# Patient Record
Sex: Female | Born: 1975 | Race: Black or African American | Hispanic: No | Marital: Single | Smoking: Never smoker
Health system: Southern US, Community
[De-identification: ages and names within clinical notes are randomized; demographics above are authoritative.]

## PROBLEM LIST (undated history)

## (undated) DIAGNOSIS — Z8619 Personal history of other infectious and parasitic diseases: Secondary | ICD-10-CM

## (undated) DIAGNOSIS — N390 Urinary tract infection, site not specified: Secondary | ICD-10-CM

## (undated) DIAGNOSIS — B379 Candidiasis, unspecified: Secondary | ICD-10-CM

## (undated) DIAGNOSIS — K219 Gastro-esophageal reflux disease without esophagitis: Secondary | ICD-10-CM

## (undated) DIAGNOSIS — M349 Systemic sclerosis, unspecified: Secondary | ICD-10-CM

## (undated) HISTORY — DX: Personal history of other infectious and parasitic diseases: Z86.19

## (undated) HISTORY — DX: Candidiasis, unspecified: B37.9

## (undated) HISTORY — DX: Urinary tract infection, site not specified: N39.0

## (undated) HISTORY — PX: NO PAST SURGERIES: SHX2092

---

## 2007-02-14 ENCOUNTER — Other Ambulatory Visit: Admission: RE | Admit: 2007-02-14 | Discharge: 2007-02-14 | Payer: Self-pay | Admitting: Obstetrics and Gynecology

## 2007-05-03 ENCOUNTER — Other Ambulatory Visit: Admission: RE | Admit: 2007-05-03 | Discharge: 2007-05-03 | Payer: Self-pay | Admitting: Obstetrics and Gynecology

## 2007-09-16 ENCOUNTER — Other Ambulatory Visit: Admission: RE | Admit: 2007-09-16 | Discharge: 2007-09-16 | Payer: Self-pay | Admitting: Obstetrics and Gynecology

## 2010-12-22 DIAGNOSIS — M349 Systemic sclerosis, unspecified: Secondary | ICD-10-CM

## 2010-12-22 HISTORY — DX: Systemic sclerosis, unspecified: M34.9

## 2011-01-22 DIAGNOSIS — N390 Urinary tract infection, site not specified: Secondary | ICD-10-CM

## 2011-01-22 HISTORY — DX: Urinary tract infection, site not specified: N39.0

## 2011-02-17 LAB — OB RESULTS CONSOLE ANTIBODY SCREEN: Antibody Screen: NEGATIVE

## 2011-02-17 LAB — OB RESULTS CONSOLE RUBELLA ANTIBODY, IGM: Rubella: IMMUNE

## 2011-02-17 LAB — OB RESULTS CONSOLE HEPATITIS B SURFACE ANTIGEN: Hepatitis B Surface Ag: NEGATIVE

## 2011-03-09 ENCOUNTER — Other Ambulatory Visit: Payer: Self-pay

## 2011-03-09 ENCOUNTER — Inpatient Hospital Stay (HOSPITAL_COMMUNITY)
Admission: AD | Admit: 2011-03-09 | Discharge: 2011-03-09 | Disposition: A | Discharge status: Home / Self Care | Payer: Medicaid Other | Source: Ambulatory Visit | Attending: Obstetrics and Gynecology | Admitting: Obstetrics and Gynecology

## 2011-03-09 ENCOUNTER — Encounter (HOSPITAL_COMMUNITY): Payer: Self-pay

## 2011-03-09 DIAGNOSIS — O99891 Other specified diseases and conditions complicating pregnancy: Secondary | ICD-10-CM | POA: Insufficient documentation

## 2011-03-09 DIAGNOSIS — M349 Systemic sclerosis, unspecified: Secondary | ICD-10-CM | POA: Insufficient documentation

## 2011-03-09 DIAGNOSIS — R0602 Shortness of breath: Secondary | ICD-10-CM | POA: Insufficient documentation

## 2011-03-09 HISTORY — DX: Systemic sclerosis, unspecified: M34.9

## 2011-03-09 LAB — URINALYSIS, ROUTINE W REFLEX MICROSCOPIC
Glucose, UA: NEGATIVE mg/dL
Hgb urine dipstick: NEGATIVE
Specific Gravity, Urine: 1.01 (ref 1.005–1.030)

## 2011-03-09 LAB — CBC
HCT: 38.5 % (ref 36.0–46.0)
Hemoglobin: 12.6 g/dL (ref 12.0–15.0)
MCV: 93.4 fL (ref 78.0–100.0)
RBC: 4.12 MIL/uL (ref 3.87–5.11)
WBC: 6.4 10*3/uL (ref 4.0–10.5)

## 2011-03-09 LAB — DIFFERENTIAL
Eosinophils Relative: 1 % (ref 0–5)
Lymphocytes Relative: 28 % (ref 12–46)
Lymphs Abs: 1.8 10*3/uL (ref 0.7–4.0)
Monocytes Absolute: 0.5 10*3/uL (ref 0.1–1.0)

## 2011-03-09 MED ORDER — OXYCODONE-ACETAMINOPHEN 10-325 MG PO TABS: 1.0000 | ORAL_TABLET | ORAL | Status: DC | PRN

## 2011-03-09 MED ORDER — LIDOCAINE HCL 2 % EX GEL: CUTANEOUS | Status: DC | PRN

## 2011-03-09 MED ORDER — IBUPROFEN 800 MG PO TABS: 800.0000 mg | ORAL_TABLET | Freq: Three times a day (TID) | ORAL | Status: DC | PRN

## 2011-03-09 NOTE — ED Provider Notes (Signed)
History   35 yo G2P0010 at 12 weeks presented c/op episode of shortness of breath upon awakening this am.  Denied cough, chest or nasal congestion, fever, viral exposure, asthma, chest pain, or extremity pain.  Diagnosed with scleroderma in Sept or October of this year, after 2 previous years of symptoms of thickened skin, blue hands.  Followed at [facility], no current medications--no pending appointment, and rheumatologist does not know patient is pregnant.   Patient had [place] at [facility] 03/03/11, no labs yet--office has requested records from [facility] regarding labs already done.  Has next visit at [facility] 03/30/10.  Reports shortness of breath woke her up, persisted when getting dressed for work, and continued at work.  Presented unannounced to [facility]U.  Reports symptoms have resolved at present.  In NAD.  History remarkable for: Scleroderma 1 previous SAB  Chief Complaint  Patient presents with  . Shortness of Breath     OB History    Grav Para Term Preterm Abortions TAB SAB Ect Mult Living   2 0 0 0 1 0 1 0 0 0     Previous SAB  Past Medical History  Diagnosis Date  . Scleroderma     dx October 2012    History reviewed. No pertinent past surgical history.  History reviewed. No pertinent family history.  History  Substance Use Topics  . Smoking status: Not on file  . Smokeless tobacco: Not on file  . Alcohol Use:     Allergies:  Allergies  Allergen Reactions  . Sulfa Antibiotics     Throat burning    Prescriptions prior to admission  Medication Sig Dispense Refill  . prenatal vitamin w/FE, FA (PRENATAL 1 + 1) 27-1 MG TABS Take 1 tablet by mouth daily.           Physical Exam   Blood pressure 91/61, pulse 75, temperature 97.5 F (36.4 C), temperature source Oral, resp. rate 18, height 5\' 8"  (1.727 m), weight 69.57 kg (153 lb 6 oz), SpO2 99.00%.  In NAD--able to sit, stand, walk, and lie flat without SOB. Chest clear Heart RRR without mumur Abd gravid, NT, FH approx  12 week size Pelvic--deferred Extremities--No edema, negative Homan's, full ROM, DTR 1+ without clonus.   ED Course  IUP at 12 weeks Episode SOB--now resolved Scleroderma  Plan: Consulted with Dr. Estanislado Pandy. Check UA, CBC w/diff, EKG. If WNL, will d/c home. Will check with office regarding status of previous records. Anticipate referral to MFM as pregnancy progresses.  Nigel Bridgeman, CNM, [place] 03/09/11  9:10a   Addendum: Still feeling OK--no recurrence of SOB.  Filed Vitals:   03/09/11 0825 03/09/11 0827 03/09/11 0832 03/09/11 0938  BP:    94/62  Pulse:  75 75 66  Temp:      TempSrc:      Resp:  18 18 16   Height:      Weight:      SpO2: 99%  99% 99%   Results for orders placed during the hospital encounter of 03/09/11 (from the past 24 hour(s))  URINALYSIS, ROUTINE W REFLEX MICROSCOPIC     Status: Normal   Collection Time   03/09/11  8:08 AM      Component Value Range   Color, Urine YELLOW  YELLOW    APPearance CLEAR  CLEAR    Specific Gravity, Urine 1.010  1.005 - 1.030    pH 6.5  5.0 - 8.0    Glucose, UA NEGATIVE  NEGATIVE (mg/dL)   Hgb  urine dipstick NEGATIVE  NEGATIVE    Bilirubin Urine NEGATIVE  NEGATIVE    Ketones, ur NEGATIVE  NEGATIVE (mg/dL)   Protein, ur NEGATIVE  NEGATIVE (mg/dL)   Urobilinogen, UA 1.0  0.0 - 1.0 (mg/dL)   Nitrite NEGATIVE  NEGATIVE    Leukocytes, UA NEGATIVE  NEGATIVE   CBC     Status: Normal   Collection Time   03/09/11  9:33 AM      Component Value Range   WBC 6.4  4.0 - 10.5 (K/uL)   RBC 4.12  3.87 - 5.11 (MIL/uL)   Hemoglobin 12.6  12.0 - 15.0 (g/dL)   HCT 16.1  09.6 - 04.5 (%)   MCV 93.4  78.0 - 100.0 (fL)   MCH 30.6  26.0 - 34.0 (pg)   MCHC 32.7  30.0 - 36.0 (g/dL)   RDW 40.9  81.1 - 91.4 (%)   Platelets 263  150 - 400 (K/uL)  DIFFERENTIAL     Status: Normal   Collection Time   03/09/11  9:33 AM      Component Value Range   Neutrophils Relative 63  43 - 77 (%)   Neutro Abs 4.0  1.7 - 7.7 (K/uL)   Lymphocytes Relative  28  12 - 46 (%)   Lymphs Abs 1.8  0.7 - 4.0 (K/uL)   Monocytes Relative 8  3 - 12 (%)   Monocytes Absolute 0.5  0.1 - 1.0 (K/uL)   Eosinophils Relative 1  0 - 5 (%)   Eosinophils Absolute 0.1  0.0 - 0.7 (K/uL)   Basophils Relative 0  0 - 1 (%)   Basophils Absolute 0.0  0.0 - 0.1 (K/uL)   D/C'd home with precautions reviewed.  To return with any issues. Recommended patient call rheumatologist to inform of pregnancy. I will have office staff follow-up on obtaining records from [facility].   Nigel Bridgeman, CNM, [place] 03/09/11 9:55am

## 2011-03-09 NOTE — Progress Notes (Signed)
Pt states has sclederma, shortness of breath since 0400 this am. Hx of SOB with disease process, however pt states she was concerned since she is pregnant. Denies bleeding or vag d/c changes. 02 sat in triage 98%ra.

## 2011-03-24 NOTE — L&D Delivery Note (Addendum)
Delivery Note At 2:51 AM a viable female, "Veronica Houston", was delivered via Vaginal, Spontaneous Delivery (Presentation: Left Occiput Anterior).  APGAR: 8, 9; weight .   Placenta status: Intact, Spontaneous.  Cord: 3 vessels with the following complications: None.  Cord pH: NA  Anesthesia: Epidural  Episiotomy: None Lacerations: Bilateral periurethral lacerations--did not require repair. Suture Repair: None Est. Blood Loss (mL): 300 cc.  Mom to postpartum.  Baby to skin to skin. Circ planned as outpatient. Patient desires BTL.  Consent form in prenata. Will make NPO x ice chips, and maintain epidural catheter. Dr. Pennie Rushing notified of patient's delivery and desire for BTL:.  Nigel Bridgeman 09/11/2011, 3:47 AM

## 2011-04-30 ENCOUNTER — Other Ambulatory Visit: Payer: Self-pay

## 2011-05-11 ENCOUNTER — Encounter (HOSPITAL_COMMUNITY): Payer: Medicaid Other

## 2011-05-13 ENCOUNTER — Ambulatory Visit (HOSPITAL_COMMUNITY): Payer: Medicaid Other

## 2011-05-13 ENCOUNTER — Ambulatory Visit (HOSPITAL_COMMUNITY)
Admission: RE | Admit: 2011-05-13 | Discharge: 2011-05-13 | Disposition: A | Discharge status: Home / Self Care | Payer: Medicaid Other | Source: Ambulatory Visit | Attending: Obstetrics and Gynecology | Admitting: Obstetrics and Gynecology

## 2011-05-13 ENCOUNTER — Encounter (HOSPITAL_COMMUNITY): Payer: Self-pay

## 2011-05-13 DIAGNOSIS — Z1231 Encounter for screening mammogram for malignant neoplasm of breast: Secondary | ICD-10-CM | POA: Insufficient documentation

## 2011-05-14 LAB — LUPUS ANTICOAGULANT PANEL: PTT Lupus Anticoagulant: 35.6 secs (ref 28.0–43.0)

## 2011-05-14 LAB — CARDIOLIPIN ANTIBODIES, IGG, IGM, IGA
Anticardiolipin IgG: 12 GPL U/mL (ref <23)
Anticardiolipin IgM: 4 MPL U/mL — ABNORMAL LOW (ref <11)

## 2011-05-14 LAB — ANTI-NUCLEAR AB-TITER (ANA TITER)

## 2011-05-14 LAB — ANA: Anti Nuclear Antibody(ANA): POSITIVE — AB

## 2011-05-27 ENCOUNTER — Encounter (INDEPENDENT_AMBULATORY_CARE_PROVIDER_SITE_OTHER): Payer: Medicaid Other | Admitting: Obstetrics and Gynecology

## 2011-05-27 DIAGNOSIS — Z331 Pregnant state, incidental: Secondary | ICD-10-CM

## 2011-06-05 ENCOUNTER — Encounter (INDEPENDENT_AMBULATORY_CARE_PROVIDER_SITE_OTHER): Payer: Medicaid Other | Admitting: Obstetrics and Gynecology

## 2011-06-05 DIAGNOSIS — Z331 Pregnant state, incidental: Secondary | ICD-10-CM

## 2011-06-07 NOTE — Progress Notes (Signed)
MFM Note  Veronica Houston is a 36 year old G32P0A1 AA female at 21+ weeks who presented for consultation due to a recent diagnosis of scleroderma. For the past 2 years, Veronica Houston has been presenting to her primary care doctors complaining of blue fingertips, swollen hands, darker areas of skin, shortness of breath and fatigue. During their workup, her ANA returned 1: 640. She was referred to a rheumatologist and was seen in October of 2012 by Dr. Virgel Manifold. 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 He did not prescribe any treatment or medication at that time.   Her symptoms have continued and fatigue and shortness of breath are the most troublesome. She also noticed, just recently, pain in her upper legs.  She denies having hypertension, diabetes, renal disease or any other possible symptoms related to scleroderma including GI, cardiac, neuromuscular, and GU involvement. No history of surgeries.  Her past history is significant for migraines, anxiety and MRSA leg abscess. Allergies: sulfa drugs.  Her only other pregnancy ended at ~6 weeks with a SAB in 2007  She works full time as a Research scientist (physical sciences).  We discussed scleroderma and pregnancy. There are very few publications in this area; however, there appeared to be a trend towards more preterm deliveries and small-for-gestational age fetuses as compared to RA and control group patients. If she should develop renal disease during pregnancy, it can very difficult to distinguish between the renal disease of preeclampsia. Her Raynaud symptoms may improve during pregnancy but other symptoms may worsen such as shortness of breath, leg pain and GERD.  Assessment: 1) IUP at 21+ weeks 2) Suspect some type of early scleroderma  - still symptomatic with fatigue and shortness of breath; I feel that she may benefit from some type of treatment,  but will need rheumatology input 3) H/O one SAB 4) H/O migraine headaches 5) H/O anxiety 6) H/O MRSA leg abscess  Recommendations 1) Baseline labs; 24 hour urine collection for protein 2) Serial USs for growth 3) If growth concerning, then antenatal testing in the third trimester 4) Delivery by 39 weeks 5) Ms. Shampine to contact Dr. Virgel Manifold to see if he will see her again while pregnant and with new insurance 6) I will check with [facility] to see if she can be seen in their rheumatology clinic  SSA, SSB, aCL, LAC and ANA were drawn today.  (Face-to-face consultation with patient: 30 min)

## 2011-06-09 ENCOUNTER — Encounter: Payer: Medicaid Other | Admitting: Obstetrics and Gynecology

## 2011-06-10 ENCOUNTER — Encounter (INDEPENDENT_AMBULATORY_CARE_PROVIDER_SITE_OTHER): Payer: Medicaid Other | Admitting: Obstetrics and Gynecology

## 2011-06-10 DIAGNOSIS — Z3480 Encounter for supervision of other normal pregnancy, unspecified trimester: Secondary | ICD-10-CM

## 2011-06-22 ENCOUNTER — Other Ambulatory Visit: Payer: Self-pay | Admitting: Obstetrics and Gynecology

## 2011-06-22 DIAGNOSIS — M349 Systemic sclerosis, unspecified: Secondary | ICD-10-CM

## 2011-06-22 DIAGNOSIS — Z331 Pregnant state, incidental: Secondary | ICD-10-CM

## 2011-06-30 ENCOUNTER — Other Ambulatory Visit: Payer: Medicaid Other

## 2011-06-30 ENCOUNTER — Encounter: Payer: Self-pay | Admitting: Obstetrics and Gynecology

## 2011-06-30 ENCOUNTER — Telehealth: Payer: Self-pay | Admitting: Obstetrics and Gynecology

## 2011-06-30 ENCOUNTER — Ambulatory Visit (INDEPENDENT_AMBULATORY_CARE_PROVIDER_SITE_OTHER): Payer: Medicaid Other

## 2011-06-30 ENCOUNTER — Ambulatory Visit (INDEPENDENT_AMBULATORY_CARE_PROVIDER_SITE_OTHER): Payer: Medicaid Other | Admitting: Obstetrics and Gynecology

## 2011-06-30 ENCOUNTER — Other Ambulatory Visit: Payer: Self-pay | Admitting: Obstetrics and Gynecology

## 2011-06-30 VITALS — BP 104/60 | Wt 173.5 lb

## 2011-06-30 DIAGNOSIS — Z331 Pregnant state, incidental: Secondary | ICD-10-CM

## 2011-06-30 DIAGNOSIS — M349 Systemic sclerosis, unspecified: Secondary | ICD-10-CM

## 2011-06-30 DIAGNOSIS — O0990 Supervision of high risk pregnancy, unspecified, unspecified trimester: Secondary | ICD-10-CM | POA: Insufficient documentation

## 2011-06-30 DIAGNOSIS — O26879 Cervical shortening, unspecified trimester: Secondary | ICD-10-CM | POA: Insufficient documentation

## 2011-06-30 LAB — US OB FOLLOW UP

## 2011-06-30 LAB — FETAL FIBRONECTIN: Fetal Fibronectin: NEGATIVE

## 2011-06-30 NOTE — Progress Notes (Signed)
Subjective:    Veronica Houston is a 36 y.o. female being seen today for her obstetrical visit. She is at [redacted]w[redacted]d gestation. Patient reports no bleeding, no contractions, no cramping, no leaking and left ankle pain especially when not active. Fetal movement: normal.  Review of Systems:   Review of Systems  Constitutional: Negative for fever and chills.  Respiratory: Positive for cough. Negative for apnea, choking, chest tightness, shortness of breath, wheezing and stridor.        Nonproductive cough for 2 wks  Musculoskeletal: Positive for arthralgias.       Left ankle pain     Objective:    BP 104/60  Wt 173 lb 8 oz (78.699 kg)  LMP 12/19/2010  Physical Exam  Maternal Exam:  Abdomen: Patient reports no abdominal tenderness. Introitus: Normal vulva.  Left ankle without edema or tenderness to palpation  FHT:  146 BPM  Uterine Size: size equals dates  Presentation: cephalic   Cervix: long/closed with presenting part ballotable  Assessment:    Pregnancy:  G2P0010 Cervical shortening on [place] without symptoms Scleroderma with possible L ankle arthralgia Persistent nonproductive cough    Plan:    Patient Active Problem List  Diagnoses  . Scleroderma  . Pregnancy complicated by cervical shortening  . Pregnancy, supervision, high-risk    FFN.  If positive, needs steroids.  If negative, repeat US in one wk and FFN in 2 wks Follow up in 1 Week with [place].  If stable, will allow pt to continue work. Note for work to limit lifting, pushing, pulling, tossing of packages to less than 10 lbs. 24 hour urine for protein per MFM consult.   OTC Claritin for cough.  If it persists, recommend CXR to rule out pulmonary involvement with autoimmune disease.

## 2011-06-30 NOTE — Telephone Encounter (Signed)
TC from patient for FFN results = Negative Results reviewed. Patient requested note for work yesterday (felt bad) Also needed note for work at the [employer] indicating limitations In pulling/pushing/lifting over 10 lbs and no bending. Notes written, available in "notes" section of chart. Patient will pick up letters for work tomorrow. Message left on Triage voice mail indicating patient will pick up tomorrow.  Nigel Bridgeman, CNM, [place] 06/30/11 6:40pm

## 2011-06-30 NOTE — Progress Notes (Signed)
PT C/O LEFT ANKLE  PAIN X 2-3 MONTHS

## 2011-07-01 LAB — RPR

## 2011-07-01 LAB — PROTEIN, URINE, 24 HOUR
Protein, 24H Urine: 140 mg/d — ABNORMAL HIGH (ref 50–100)
Protein, Urine: 5 mg/dL

## 2011-07-01 LAB — GLUCOSE TOLERANCE, 1 HOUR: Glucose, 1 Hour GTT: 71 mg/dL (ref 70–140)

## 2011-07-07 ENCOUNTER — Ambulatory Visit (HOSPITAL_COMMUNITY)
Admission: RE | Admit: 2011-07-07 | Discharge: 2011-07-07 | Disposition: A | Discharge status: Home / Self Care | Payer: Medicaid Other | Source: Ambulatory Visit | Attending: Obstetrics and Gynecology | Admitting: Obstetrics and Gynecology

## 2011-07-07 ENCOUNTER — Ambulatory Visit (INDEPENDENT_AMBULATORY_CARE_PROVIDER_SITE_OTHER): Payer: Medicaid Other | Admitting: Obstetrics and Gynecology

## 2011-07-07 ENCOUNTER — Other Ambulatory Visit: Payer: Medicaid Other

## 2011-07-07 ENCOUNTER — Ambulatory Visit (INDEPENDENT_AMBULATORY_CARE_PROVIDER_SITE_OTHER): Payer: Medicaid Other

## 2011-07-07 ENCOUNTER — Other Ambulatory Visit: Payer: Self-pay | Admitting: Obstetrics and Gynecology

## 2011-07-07 VITALS — BP 98/58 | Wt 176.0 lb

## 2011-07-07 DIAGNOSIS — R053 Chronic cough: Secondary | ICD-10-CM

## 2011-07-07 DIAGNOSIS — Z331 Pregnant state, incidental: Secondary | ICD-10-CM

## 2011-07-07 DIAGNOSIS — O99891 Other specified diseases and conditions complicating pregnancy: Secondary | ICD-10-CM | POA: Insufficient documentation

## 2011-07-07 DIAGNOSIS — R05 Cough: Secondary | ICD-10-CM | POA: Insufficient documentation

## 2011-07-07 DIAGNOSIS — M349 Systemic sclerosis, unspecified: Secondary | ICD-10-CM

## 2011-07-07 DIAGNOSIS — R059 Cough, unspecified: Secondary | ICD-10-CM

## 2011-07-07 LAB — US OB TRANSVAGINAL

## 2011-07-07 MED ORDER — AZITHROMYCIN 250 MG PO TABS: ORAL_TABLET | ORAL | Status: AC

## 2011-07-07 MED ORDER — HYDROXYZINE PAMOATE 25 MG PO CAPS: 25.0000 mg | ORAL_CAPSULE | ORAL | Status: AC | PRN

## 2011-07-07 NOTE — Progress Notes (Addendum)
Subjective:    Veronica Houston is a 36 y.o. female being seen today for her obstetrical visit. She is at [redacted]w[redacted]d gestation. Patient reports denies VB, cramping, LOF, or ctx, c/o peristent cough. Fetal movement: normal.  Review of Systems:   Review of Systems  HENT: Positive for congestion and rhinorrhea.   Respiratory: Positive for cough. Negative for apnea, chest tightness, shortness of breath and wheezing.        Cough is persistent x1 month  All other systems reviewed and are negative.     Objective:    BP 98/58  Wt 176 lb (79.833 kg)  LMP 12/19/2010  Physical Exam  Nursing note and vitals reviewed. Constitutional: She appears well-developed and well-nourished.  Eyes: Pupils are equal, round, and reactive to light.  Neck: Normal range of motion.  Cardiovascular: Normal rate, regular rhythm and normal heart sounds.   Respiratory: Effort normal and breath sounds normal. No respiratory distress. She has no wheezes.  GI: Soft.       s=d  Lymphadenopathy:       Head (right side): Tonsillar adenopathy present.    She has cervical adenopathy.       Right: Supraclavicular adenopathy present.    Exam Korea today for cervical length = 2.52cm, no funneling noted (previously 2.43cm on 4-9)  FHT:  143 BPM  Uterine Size: size equals dates  Presentation: cephalic     Baseline 24hour urine protein = 140mg   Assessment:    Pregnancy:  G2P0010 Suspect URI - will obtain chest xray  Cervical length stable - pt may return to work     Plan:    Patient Active Problem List  Diagnoses  . Scleroderma  . Pregnancy complicated by cervical shortening  . Pregnancy, supervision, high-risk    1wk f/u for repeat FFN 4wks growth [place] To [facility] for chest xray zpack RX Recommend mucinex, Pt request med for sleep states benedryl causes severe fatigue will RX vistaril Follow up in 1 Week.

## 2011-07-08 ENCOUNTER — Telehealth: Payer: Self-pay

## 2011-07-08 NOTE — Telephone Encounter (Signed)
Spoke with pt informing her she was exposed to shingles yesterday afternoon during her chest xray. Sherry from the radiology dept at [facility] called to inform us per hospital protocol, the xray technician was diagnosed yesterday evening. Informed pt since she has had chicken pox as a child and the vaccination, no further testing is needed. Pt voices understanding.

## 2011-07-14 ENCOUNTER — Ambulatory Visit (INDEPENDENT_AMBULATORY_CARE_PROVIDER_SITE_OTHER): Payer: Medicaid Other | Admitting: Obstetrics and Gynecology

## 2011-07-14 ENCOUNTER — Encounter: Payer: Self-pay | Admitting: Obstetrics and Gynecology

## 2011-07-14 VITALS — BP 100/56 | Wt 179.0 lb

## 2011-07-14 DIAGNOSIS — O26879 Cervical shortening, unspecified trimester: Secondary | ICD-10-CM

## 2011-07-14 DIAGNOSIS — G4700 Insomnia, unspecified: Secondary | ICD-10-CM

## 2011-07-14 NOTE — Progress Notes (Signed)
No c/o.  Cx stable with current work and activity schedule [place] at [facility] for growth and cx length

## 2011-07-21 ENCOUNTER — Encounter: Payer: Self-pay | Admitting: Obstetrics and Gynecology

## 2011-07-21 ENCOUNTER — Ambulatory Visit (INDEPENDENT_AMBULATORY_CARE_PROVIDER_SITE_OTHER): Payer: Medicaid Other | Admitting: Obstetrics and Gynecology

## 2011-07-21 VITALS — BP 100/60 | Temp 97.2°F | Wt 177.0 lb

## 2011-07-21 DIAGNOSIS — O0990 Supervision of high risk pregnancy, unspecified, unspecified trimester: Secondary | ICD-10-CM

## 2011-07-21 DIAGNOSIS — M349 Systemic sclerosis, unspecified: Secondary | ICD-10-CM

## 2011-07-21 NOTE — Progress Notes (Addendum)
No complaints FKCs and PTL precautions Cervix unchanged U/S at [facility] for EFW and check cervix secondary to scleroderma Wants sterilization - sign tubal papers today

## 2011-07-21 NOTE — Patient Instructions (Signed)
Needs to sign tubal papers

## 2011-07-23 ENCOUNTER — Telehealth: Payer: Self-pay | Admitting: Obstetrics and Gynecology

## 2011-07-23 NOTE — Telephone Encounter (Signed)
Lm on vm tcb rgd msg 

## 2011-07-23 NOTE — Telephone Encounter (Signed)
Routed to triage 

## 2011-07-27 NOTE — Telephone Encounter (Signed)
Spoke with pt rgd msg pt want rx for pain has appt 07/31/11 advised pt can discuss at [facility]. Pt voice understanding

## 2011-07-31 ENCOUNTER — Ambulatory Visit (INDEPENDENT_AMBULATORY_CARE_PROVIDER_SITE_OTHER): Payer: Medicaid Other

## 2011-07-31 ENCOUNTER — Ambulatory Visit (INDEPENDENT_AMBULATORY_CARE_PROVIDER_SITE_OTHER): Payer: Medicaid Other | Admitting: Obstetrics and Gynecology

## 2011-07-31 VITALS — BP 104/62 | Wt 176.0 lb

## 2011-07-31 DIAGNOSIS — O0990 Supervision of high risk pregnancy, unspecified, unspecified trimester: Secondary | ICD-10-CM

## 2011-07-31 DIAGNOSIS — Z3400 Encounter for supervision of normal first pregnancy, unspecified trimester: Secondary | ICD-10-CM

## 2011-07-31 DIAGNOSIS — O26879 Cervical shortening, unspecified trimester: Secondary | ICD-10-CM

## 2011-07-31 DIAGNOSIS — M349 Systemic sclerosis, unspecified: Secondary | ICD-10-CM

## 2011-07-31 LAB — US OB FOLLOW UP

## 2011-07-31 NOTE — Progress Notes (Signed)
No complaints except recently placed back on plaquenil by her MD secondary to scleroderma bothering her. Reviewed plaquenil is cat C U/S reviewed - 4lbs 10oz, 55%, AFI 14, vtx, ant placenta, cvx with no funneling meas 2.5cm (last u/s 4/9 meas 2.4cm. Stable cervix Cont limited activity Pt says she comes weekly FKCs RTO 1wk

## 2011-08-07 ENCOUNTER — Ambulatory Visit (INDEPENDENT_AMBULATORY_CARE_PROVIDER_SITE_OTHER): Payer: Medicaid Other | Admitting: Obstetrics and Gynecology

## 2011-08-07 ENCOUNTER — Encounter: Payer: Self-pay | Admitting: Obstetrics and Gynecology

## 2011-08-07 VITALS — BP 102/58 | Wt 179.0 lb

## 2011-08-07 DIAGNOSIS — Z331 Pregnant state, incidental: Secondary | ICD-10-CM

## 2011-08-07 NOTE — Progress Notes (Signed)
Pt without complaint [facility] reviewed Korea @ 35-36 weeks No cervical change Continue plaquenil for scleroderma

## 2011-08-07 NOTE — Progress Notes (Signed)
Pt is concerned about swelling in her feet. Pt did not leave a urine sample but will leave one before she leaves the office today.

## 2011-08-12 ENCOUNTER — Encounter: Payer: Self-pay | Admitting: Obstetrics and Gynecology

## 2011-08-12 ENCOUNTER — Ambulatory Visit (INDEPENDENT_AMBULATORY_CARE_PROVIDER_SITE_OTHER): Payer: Medicaid Other | Admitting: Obstetrics and Gynecology

## 2011-08-12 VITALS — BP 90/58 | Wt 177.0 lb

## 2011-08-12 DIAGNOSIS — Z331 Pregnant state, incidental: Secondary | ICD-10-CM

## 2011-08-12 NOTE — Progress Notes (Signed)
Patient ID: Veronica Houston, female   DOB: [birth year], 36 y.o.   MRN: [id number] Reviewed s/s preterm labor, srom, vag bleeding, kick counts to report, enc 8 water daily and frequent voids Lavera Guise, CNM

## 2011-08-12 NOTE — Progress Notes (Signed)
Wants cervix checked

## 2011-08-13 ENCOUNTER — Telehealth: Payer: Self-pay | Admitting: Obstetrics and Gynecology

## 2011-08-13 NOTE — Telephone Encounter (Signed)
Spoke with pt rgd concerns pt 34 weeks preg c/o abdominal pain radiating to back ? Contraction positive fetal movement no spotting no bleeding no leaking fluid did no not take anything for discomfort only drinking two bottles of water a day. Advised pt to take tylenol every 6hrs increase water intake if not better call office for eval pt voice understanding

## 2011-08-18 ENCOUNTER — Telehealth: Payer: Self-pay | Admitting: Obstetrics and Gynecology

## 2011-08-18 NOTE — Telephone Encounter (Signed)
TC from pt. States had cont q 1-2 hr last PM.  Today having 2-3/hr. +FM  NO vag leakage.  Drank 4 glasses water today.   Is expected to stand at work.  Will consult with provider.

## 2011-08-18 NOTE — Telephone Encounter (Signed)
TC to pt. Per SL advised to continue to increase water.  Rest whenerver possible. To call if having 5 or more cont /hr  or sypmtoms increase,  Pt states is very tired and does not feel not working the next  3 hours..  Explained can only give work note for medical reason.  Pt understanding. R/s appt to earlier in day 08/19/11.

## 2011-08-18 NOTE — Telephone Encounter (Signed)
TC to pt.  Per SL informed if concerned and feels needs eval can be seen at [facility].  If chooses, may leave work to rest.   Pt verbalizes comprehension and will call if desires eval or sx increase.

## 2011-08-19 ENCOUNTER — Encounter: Payer: Self-pay | Admitting: Obstetrics and Gynecology

## 2011-08-19 ENCOUNTER — Encounter: Payer: Medicaid Other | Admitting: Obstetrics and Gynecology

## 2011-08-19 ENCOUNTER — Ambulatory Visit (INDEPENDENT_AMBULATORY_CARE_PROVIDER_SITE_OTHER): Payer: Medicaid Other | Admitting: Obstetrics and Gynecology

## 2011-08-19 VITALS — BP 108/56 | Wt 177.0 lb

## 2011-08-19 DIAGNOSIS — Z3480 Encounter for supervision of other normal pregnancy, unspecified trimester: Secondary | ICD-10-CM

## 2011-08-19 DIAGNOSIS — N899 Noninflammatory disorder of vagina, unspecified: Secondary | ICD-10-CM

## 2011-08-19 DIAGNOSIS — M349 Systemic sclerosis, unspecified: Secondary | ICD-10-CM

## 2011-08-19 DIAGNOSIS — N898 Other specified noninflammatory disorders of vagina: Secondary | ICD-10-CM

## 2011-08-19 LAB — POCT URINALYSIS DIPSTICK
Blood, UA: NEGATIVE
Glucose, UA: NEGATIVE
Ketones, UA: NEGATIVE
Protein, UA: NEGATIVE
Spec Grav, UA: 1.015

## 2011-08-19 NOTE — Progress Notes (Signed)
C/o of vaginal irritation (Thinks it may be laundry detergent)

## 2011-08-19 NOTE — Progress Notes (Signed)
Doing well. Some contractions this week. GBS today. Cervix FT, 50%,vtx -1 Per Dr. Sherrie George original plan, plan induction at 39 weeks, with Korea for growth at 36 weeks.  Will have visit with [facility] next week per patient request, complete OB US, and discussion of plan for induction.

## 2011-08-19 NOTE — Progress Notes (Signed)
Wants cx checked  

## 2011-08-21 LAB — STREP B DNA PROBE: GBSP: NEGATIVE

## 2011-08-24 ENCOUNTER — Telehealth: Payer: Self-pay | Admitting: Obstetrics and Gynecology

## 2011-08-24 NOTE — Telephone Encounter (Signed)
TC to pt.  States received note stating can work 8 hrs/day.   However, normally works only 6.5 hours.   Wants note to return to that schedule.   To consult with VL.  Pt will pick up note/

## 2011-08-24 NOTE — Telephone Encounter (Signed)
Triage/pt has appt. In two days on 08/26/11

## 2011-08-24 NOTE — Telephone Encounter (Signed)
Spoke with pt informing her letter was approved per VL. Pt reqs to pick up today before 5 pm.

## 2011-08-26 ENCOUNTER — Other Ambulatory Visit: Payer: Self-pay | Admitting: Obstetrics and Gynecology

## 2011-08-26 ENCOUNTER — Ambulatory Visit (INDEPENDENT_AMBULATORY_CARE_PROVIDER_SITE_OTHER): Payer: Medicaid Other | Admitting: Obstetrics and Gynecology

## 2011-08-26 ENCOUNTER — Ambulatory Visit (INDEPENDENT_AMBULATORY_CARE_PROVIDER_SITE_OTHER): Payer: Medicaid Other

## 2011-08-26 ENCOUNTER — Encounter: Payer: Self-pay | Admitting: Obstetrics and Gynecology

## 2011-08-26 VITALS — BP 98/62 | Ht 68.0 in | Wt 177.0 lb

## 2011-08-26 DIAGNOSIS — Z202 Contact with and (suspected) exposure to infections with a predominantly sexual mode of transmission: Secondary | ICD-10-CM

## 2011-08-26 DIAGNOSIS — M349 Systemic sclerosis, unspecified: Secondary | ICD-10-CM

## 2011-08-26 DIAGNOSIS — Z331 Pregnant state, incidental: Secondary | ICD-10-CM

## 2011-08-26 DIAGNOSIS — Z2089 Contact with and (suspected) exposure to other communicable diseases: Secondary | ICD-10-CM

## 2011-08-26 LAB — US OB FOLLOW UP

## 2011-08-26 LAB — HIV ANTIBODY (ROUTINE TESTING W REFLEX): HIV: NONREACTIVE

## 2011-08-26 NOTE — Progress Notes (Addendum)
Pt request STD check:  Gonorrhea and Chlamydia HIV and herpes simplex virus performed GBS negative Will schedule induction for 39 weeks Ultrasound: EFW;  5 lb 13 oz, nl fluid  vertex

## 2011-08-26 NOTE — Assessment & Plan Note (Signed)
Per MFM delivery by 39 weeks

## 2011-08-27 LAB — GC/CHLAMYDIA PROBE AMP, GENITAL: Chlamydia, DNA Probe: NEGATIVE

## 2011-08-27 LAB — HSV 1 ANTIBODY, IGG: HSV 1 Glycoprotein G Ab, IgG: 6 IV — ABNORMAL HIGH

## 2011-08-27 LAB — HSV 2 ANTIBODY, IGG: HSV 2 Glycoprotein G Ab, IgG: 11.76 IV — ABNORMAL HIGH

## 2011-08-31 ENCOUNTER — Encounter: Payer: Self-pay | Admitting: Obstetrics and Gynecology

## 2011-08-31 ENCOUNTER — Telehealth: Payer: Self-pay | Admitting: Obstetrics and Gynecology

## 2011-08-31 ENCOUNTER — Other Ambulatory Visit: Payer: Self-pay | Admitting: Obstetrics and Gynecology

## 2011-08-31 MED ORDER — HYDROCOD POLST-CHLORPHEN POLST 10-8 MG/5ML PO LQCR: 5.0000 mL | Freq: Two times a day (BID) | ORAL | Status: DC | PRN

## 2011-08-31 NOTE — Telephone Encounter (Signed)
Triage/cht received 

## 2011-08-31 NOTE — Telephone Encounter (Signed)
TC from pt.   States has been vomiting for months.  Has tried all recommendations including Claritin, Musinex, Sudafed, Pepcid AC.  Still having heartburn and coughing so much she is vomiting and having urinary incontinence.  +FM  No cont. Normal vag D/C when not coughing. Was able to retain food and fluids today.  Also awaiting  F/U appts.  Per [facility], RX E-scribed for Tussionex. Suggested Prevacid or Zantac BID. Appt for U/S and Rob with DR SR scheduled 09/01/11.

## 2011-09-01 ENCOUNTER — Telehealth: Payer: Self-pay | Admitting: Obstetrics and Gynecology

## 2011-09-01 ENCOUNTER — Ambulatory Visit (INDEPENDENT_AMBULATORY_CARE_PROVIDER_SITE_OTHER): Payer: Medicaid Other | Admitting: Obstetrics and Gynecology

## 2011-09-01 ENCOUNTER — Ambulatory Visit: Payer: Medicaid Other

## 2011-09-01 ENCOUNTER — Telehealth (HOSPITAL_COMMUNITY): Payer: Self-pay | Admitting: Obstetrics and Gynecology

## 2011-09-01 VITALS — BP 100/64 | Wt 175.0 lb

## 2011-09-01 DIAGNOSIS — O26879 Cervical shortening, unspecified trimester: Secondary | ICD-10-CM

## 2011-09-01 DIAGNOSIS — Z331 Pregnant state, incidental: Secondary | ICD-10-CM

## 2011-09-01 DIAGNOSIS — M349 Systemic sclerosis, unspecified: Secondary | ICD-10-CM

## 2011-09-01 MED ORDER — PREDNISONE 5 MG PO TABS: ORAL_TABLET | ORAL | Status: DC

## 2011-09-01 NOTE — Progress Notes (Signed)
Scleroderma with normal serial growth. MFM recommends delivery at 39 weeks Nitrazine negative Fern:negative Lungs clear but deep persistent cough for last 3 months despite Z-pack. CXR 4/13 c/w sceroderma Called Dr Viviann Spare at [facility] and reviewed above: recommends to start on Prednisone 40 mg daily X 7 days then 20 mg daily x 7 days then 10 mg daily x 7 days Wants  To see pt in early post-partum for evaluation +/- Ct scan +/- lung biopsy

## 2011-09-01 NOTE — Progress Notes (Signed)
Pt. Stated she is unsure if she is leaking or  Peeing on herself from coughing .

## 2011-09-01 NOTE — Telephone Encounter (Signed)
TC from patient--37 weeks, with persistent hacking cough.  Spoke with Harriett Sine today, was expecting Rx for Tussionex to be called in to [facility] on [address], but no Rx there.  Called in Rx as requested, 1tsp q 12 hours prn cough, dispense #120 cc, no refills. Apologized to patient for confusion.  To follow-up as scheduled or prn.

## 2011-09-02 ENCOUNTER — Other Ambulatory Visit: Payer: Medicaid Other

## 2011-09-08 ENCOUNTER — Ambulatory Visit (INDEPENDENT_AMBULATORY_CARE_PROVIDER_SITE_OTHER): Payer: Medicaid Other | Admitting: Obstetrics and Gynecology

## 2011-09-08 ENCOUNTER — Encounter: Payer: Self-pay | Admitting: Obstetrics and Gynecology

## 2011-09-08 ENCOUNTER — Telehealth (HOSPITAL_COMMUNITY): Payer: Self-pay | Admitting: *Deleted

## 2011-09-08 VITALS — BP 104/60 | Wt 184.0 lb

## 2011-09-08 DIAGNOSIS — O0990 Supervision of high risk pregnancy, unspecified, unspecified trimester: Secondary | ICD-10-CM

## 2011-09-08 DIAGNOSIS — M349 Systemic sclerosis, unspecified: Secondary | ICD-10-CM

## 2011-09-08 NOTE — Telephone Encounter (Signed)
Preadmission screen  

## 2011-09-08 NOTE — Progress Notes (Signed)
No complaints FKCs Pt scheduled for indxn (per pt) at 39wks (MFM rec) Labor Precautions Note for work starting 6/15 and note for court date on the 24th

## 2011-09-09 ENCOUNTER — Encounter (HOSPITAL_COMMUNITY): Payer: Self-pay | Admitting: *Deleted

## 2011-09-10 ENCOUNTER — Inpatient Hospital Stay (HOSPITAL_COMMUNITY)
Admission: AD | Admit: 2011-09-10 | Discharge: 2011-09-12 | DRG: 775 | Disposition: A | Discharge status: Home / Self Care | Payer: Medicaid Other | Source: Ambulatory Visit | Attending: Obstetrics and Gynecology | Admitting: Obstetrics and Gynecology

## 2011-09-10 ENCOUNTER — Encounter (HOSPITAL_COMMUNITY): Payer: Self-pay | Admitting: *Deleted

## 2011-09-10 DIAGNOSIS — M349 Systemic sclerosis, unspecified: Secondary | ICD-10-CM

## 2011-09-10 DIAGNOSIS — O0990 Supervision of high risk pregnancy, unspecified, unspecified trimester: Secondary | ICD-10-CM

## 2011-09-10 DIAGNOSIS — O26879 Cervical shortening, unspecified trimester: Principal | ICD-10-CM | POA: Diagnosis present

## 2011-09-10 DIAGNOSIS — Z302 Encounter for sterilization: Secondary | ICD-10-CM

## 2011-09-10 DIAGNOSIS — IMO0001 Reserved for inherently not codable concepts without codable children: Secondary | ICD-10-CM

## 2011-09-10 MED ORDER — LACTATED RINGERS IV SOLN
INTRAVENOUS | Status: DC
  Administered 2011-09-11: 01:00:00 via INTRAVENOUS

## 2011-09-10 MED ORDER — CITRIC ACID-SODIUM CITRATE 334-500 MG/5ML PO SOLN: 30.0000 mL | ORAL | Status: DC | PRN

## 2011-09-10 MED ORDER — FENTANYL 2.5 MCG/ML BUPIVACAINE 1/10 % EPIDURAL INFUSION (WH - ANES)
14.0000 mL/h | INTRAMUSCULAR | Status: DC
  Filled 2011-09-10: qty 60

## 2011-09-10 MED ORDER — PHENYLEPHRINE 40 MCG/ML (10ML) SYRINGE FOR IV PUSH (FOR BLOOD PRESSURE SUPPORT): 80.0000 ug | PREFILLED_SYRINGE | INTRAVENOUS | Status: DC | PRN

## 2011-09-10 MED ORDER — DIPHENHYDRAMINE HCL 50 MG/ML IJ SOLN: 12.5000 mg | INTRAMUSCULAR | Status: DC | PRN

## 2011-09-10 MED ORDER — LACTATED RINGERS IV SOLN: 500.0000 mL | INTRAVENOUS | Status: DC | PRN

## 2011-09-10 MED ORDER — ACETAMINOPHEN 325 MG PO TABS: 650.0000 mg | ORAL_TABLET | ORAL | Status: DC | PRN

## 2011-09-10 MED ORDER — OXYTOCIN 40 UNITS IN LACTATED RINGERS INFUSION - SIMPLE MED: 62.5000 mL/h | Freq: Once | INTRAVENOUS | Status: DC

## 2011-09-10 MED ORDER — OXYTOCIN BOLUS FROM INFUSION
250.0000 mL | Freq: Once | INTRAVENOUS | Status: DC
  Filled 2011-09-10: qty 500

## 2011-09-10 MED ORDER — EPHEDRINE 5 MG/ML INJ
10.0000 mg | INTRAVENOUS | Status: DC | PRN
  Filled 2011-09-10: qty 4

## 2011-09-10 MED ORDER — PHENYLEPHRINE 40 MCG/ML (10ML) SYRINGE FOR IV PUSH (FOR BLOOD PRESSURE SUPPORT)
80.0000 ug | PREFILLED_SYRINGE | INTRAVENOUS | Status: DC | PRN
  Filled 2011-09-10: qty 5

## 2011-09-10 MED ORDER — IBUPROFEN 600 MG PO TABS: 600.0000 mg | ORAL_TABLET | Freq: Four times a day (QID) | ORAL | Status: DC | PRN

## 2011-09-10 MED ORDER — LIDOCAINE HCL (PF) 1 % IJ SOLN
30.0000 mL | INTRAMUSCULAR | Status: DC | PRN
  Filled 2011-09-10: qty 30

## 2011-09-10 MED ORDER — LACTATED RINGERS IV SOLN: 500.0000 mL | Freq: Once | INTRAVENOUS | Status: DC

## 2011-09-10 MED ORDER — BUTORPHANOL TARTRATE 2 MG/ML IJ SOLN: 1.0000 mg | INTRAMUSCULAR | Status: DC | PRN

## 2011-09-10 MED ORDER — OXYCODONE-ACETAMINOPHEN 5-325 MG PO TABS: 1.0000 | ORAL_TABLET | ORAL | Status: DC | PRN

## 2011-09-10 MED ORDER — FLEET ENEMA 7-19 GM/118ML RE ENEM: 1.0000 | ENEMA | RECTAL | Status: DC | PRN

## 2011-09-10 MED ORDER — EPHEDRINE 5 MG/ML INJ: 10.0000 mg | INTRAVENOUS | Status: DC | PRN

## 2011-09-10 MED ORDER — ONDANSETRON HCL 4 MG/2ML IJ SOLN: 4.0000 mg | Freq: Four times a day (QID) | INTRAMUSCULAR | Status: DC | PRN

## 2011-09-10 NOTE — MAU Note (Signed)
HAD TO REMOVE PT FROM CAR- WITH W/C- BROUGHT TO RM 4.  SROM AT 9PM- CLEAR. DENIES HSV AND MRSA.  VE IN OFFICE 1 CM.
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
## 2011-09-11 ENCOUNTER — Encounter (HOSPITAL_COMMUNITY): Payer: Self-pay | Admitting: Anesthesiology

## 2011-09-11 ENCOUNTER — Encounter (HOSPITAL_COMMUNITY)
Admission: AD | Disposition: A | Discharge status: Home / Self Care | Payer: Self-pay | Source: Ambulatory Visit | Attending: Obstetrics and Gynecology

## 2011-09-11 ENCOUNTER — Inpatient Hospital Stay (HOSPITAL_COMMUNITY): Payer: Medicaid Other | Admitting: Anesthesiology

## 2011-09-11 ENCOUNTER — Encounter (HOSPITAL_COMMUNITY): Payer: Self-pay | Admitting: *Deleted

## 2011-09-11 DIAGNOSIS — Z302 Encounter for sterilization: Secondary | ICD-10-CM

## 2011-09-11 DIAGNOSIS — M349 Systemic sclerosis, unspecified: Secondary | ICD-10-CM

## 2011-09-11 LAB — SURGICAL PCR SCREEN: Staphylococcus aureus: NEGATIVE

## 2011-09-11 LAB — CBC
Hemoglobin: 11.3 g/dL — ABNORMAL LOW (ref 12.0–15.0)
MCH: 29.6 pg (ref 26.0–34.0)
RBC: 3.82 MIL/uL — ABNORMAL LOW (ref 3.87–5.11)

## 2011-09-11 LAB — RPR: RPR Ser Ql: NONREACTIVE

## 2011-09-11 LAB — ABO/RH: ABO/RH(D): O POS

## 2011-09-11 SURGERY — LIGATION, FALLOPIAN TUBE, POSTPARTUM
Anesthesia: Epidural | Laterality: Bilateral

## 2011-09-11 MED ORDER — IBUPROFEN 600 MG PO TABS
600.0000 mg | ORAL_TABLET | Freq: Four times a day (QID) | ORAL | Status: DC
  Administered 2011-09-11 – 2011-09-12 (×4): 600 mg via ORAL
  Filled 2011-09-11 (×4): qty 1

## 2011-09-11 MED ORDER — ONDANSETRON HCL 4 MG PO TABS
4.0000 mg | ORAL_TABLET | ORAL | Status: DC | PRN
Start: 2011-09-11 — End: 2011-09-12

## 2011-09-11 MED ORDER — FAMOTIDINE 20 MG PO TABS
40.0000 mg | ORAL_TABLET | Freq: Once | ORAL | Status: AC
  Administered 2011-09-11: 40 mg via ORAL
  Filled 2011-09-11: qty 2

## 2011-09-11 MED ORDER — FENTANYL CITRATE 0.05 MG/ML IJ SOLN
INTRAMUSCULAR | Status: AC
  Filled 2011-09-11: qty 2

## 2011-09-11 MED ORDER — SODIUM BICARBONATE 8.4 % IV SOLN
INTRAVENOUS | Status: AC
  Filled 2011-09-11: qty 50

## 2011-09-11 MED ORDER — HYDROXYCHLOROQUINE SULFATE 200 MG PO TABS
200.0000 mg | ORAL_TABLET | Freq: Two times a day (BID) | ORAL | Status: DC
  Administered 2011-09-11 – 2011-09-12 (×2): 200 mg via ORAL
  Filled 2011-09-11 (×5): qty 1

## 2011-09-11 MED ORDER — LACTATED RINGERS IV SOLN
INTRAVENOUS | Status: DC
  Administered 2011-09-11: 20 mL via INTRAVENOUS

## 2011-09-11 MED ORDER — FENTANYL 2.5 MCG/ML BUPIVACAINE 1/10 % EPIDURAL INFUSION (WH - ANES)
INTRAMUSCULAR | Status: DC | PRN
  Administered 2011-09-11: 14 mL/h via EPIDURAL

## 2011-09-11 MED ORDER — SENNOSIDES-DOCUSATE SODIUM 8.6-50 MG PO TABS
2.0000 | ORAL_TABLET | Freq: Every day | ORAL | Status: DC
  Administered 2011-09-11: 2 via ORAL

## 2011-09-11 MED ORDER — ONDANSETRON HCL 4 MG/2ML IJ SOLN
INTRAMUSCULAR | Status: AC
  Filled 2011-09-11: qty 2

## 2011-09-11 MED ORDER — PREDNISONE 5 MG PO TABS
5.0000 mg | ORAL_TABLET | Freq: Two times a day (BID) | ORAL | Status: DC
  Administered 2011-09-11 – 2011-09-12 (×3): 5 mg via ORAL
  Filled 2011-09-11 (×5): qty 1

## 2011-09-11 MED ORDER — TETANUS-DIPHTH-ACELL PERTUSSIS 5-2.5-18.5 LF-MCG/0.5 IM SUSP
0.5000 mL | Freq: Once | INTRAMUSCULAR | Status: AC
  Administered 2011-09-12: 0.5 mL via INTRAMUSCULAR

## 2011-09-11 MED ORDER — BENZOCAINE-MENTHOL 20-0.5 % EX AERO
1.0000 "application " | INHALATION_SPRAY | CUTANEOUS | Status: DC | PRN
  Administered 2011-09-11: 1 via TOPICAL
  Filled 2011-09-11: qty 56

## 2011-09-11 MED ORDER — WITCH HAZEL-GLYCERIN EX PADS: 1.0000 "application " | MEDICATED_PAD | CUTANEOUS | Status: DC | PRN

## 2011-09-11 MED ORDER — METOCLOPRAMIDE HCL 10 MG PO TABS
10.0000 mg | ORAL_TABLET | Freq: Once | ORAL | Status: AC
  Administered 2011-09-11: 10 mg via ORAL
  Filled 2011-09-11: qty 1

## 2011-09-11 MED ORDER — ONDANSETRON HCL 4 MG/2ML IJ SOLN: 4.0000 mg | INTRAMUSCULAR | Status: DC | PRN

## 2011-09-11 MED ORDER — SIMETHICONE 80 MG PO CHEW: 80.0000 mg | CHEWABLE_TABLET | ORAL | Status: DC | PRN

## 2011-09-11 MED ORDER — ZOLPIDEM TARTRATE 5 MG PO TABS: 5.0000 mg | ORAL_TABLET | Freq: Every evening | ORAL | Status: DC | PRN

## 2011-09-11 MED ORDER — HYDROCOD POLST-CHLORPHEN POLST 10-8 MG/5ML PO LQCR: 5.0000 mL | Freq: Two times a day (BID) | ORAL | Status: DC | PRN

## 2011-09-11 MED ORDER — MIDAZOLAM HCL 2 MG/2ML IJ SOLN
INTRAMUSCULAR | Status: AC
  Filled 2011-09-11: qty 2

## 2011-09-11 MED ORDER — DIBUCAINE 1 % RE OINT: 1.0000 "application " | TOPICAL_OINTMENT | RECTAL | Status: DC | PRN

## 2011-09-11 MED ORDER — SODIUM CHLORIDE 0.9 % IJ SOLN
3.0000 mL | Freq: Once | INTRAMUSCULAR | Status: AC
  Administered 2011-09-11: 3 mL via INTRAVENOUS

## 2011-09-11 MED ORDER — OXYTOCIN 40 UNITS IN LACTATED RINGERS INFUSION - SIMPLE MED
INTRAVENOUS | Status: AC
  Filled 2011-09-11: qty 1000

## 2011-09-11 MED ORDER — LIDOCAINE-EPINEPHRINE (PF) 2 %-1:200000 IJ SOLN
INTRAMUSCULAR | Status: AC
Start: 2011-09-11 — End: 2011-09-11
  Filled 2011-09-11: qty 20

## 2011-09-11 MED ORDER — PRENATAL MULTIVITAMIN CH
1.0000 | ORAL_TABLET | Freq: Every day | ORAL | Status: DC
  Administered 2011-09-11 – 2011-09-12 (×2): 1 via ORAL
  Filled 2011-09-11 (×2): qty 1

## 2011-09-11 MED ORDER — GUAIFENESIN ER 600 MG PO TB12
1200.0000 mg | ORAL_TABLET | ORAL | Status: DC
Start: 2011-09-11 — End: 2011-09-12
  Filled 2011-09-11 (×3): qty 2

## 2011-09-11 MED ORDER — LIDOCAINE HCL (PF) 1 % IJ SOLN
INTRAMUSCULAR | Status: DC | PRN
  Administered 2011-09-11 (×2): 4 mL

## 2011-09-11 MED ORDER — DIPHENHYDRAMINE HCL 25 MG PO CAPS: 25.0000 mg | ORAL_CAPSULE | Freq: Four times a day (QID) | ORAL | Status: DC | PRN

## 2011-09-11 MED ORDER — LANOLIN HYDROUS EX OINT: TOPICAL_OINTMENT | CUTANEOUS | Status: DC | PRN

## 2011-09-11 MED ORDER — OXYCODONE-ACETAMINOPHEN 5-325 MG PO TABS: 1.0000 | ORAL_TABLET | ORAL | Status: DC | PRN

## 2011-09-11 SURGICAL SUPPLY — 22 items
CHLORAPREP W/TINT 26ML (MISCELLANEOUS) IMPLANT
CLOTH BEACON ORANGE TIMEOUT ST (SAFETY) IMPLANT
CONTAINER PREFILL 10% NBF 15ML (MISCELLANEOUS) IMPLANT
DERMABOND ADVANCED (GAUZE/BANDAGES/DRESSINGS)
DERMABOND ADVANCED .7 DNX12 (GAUZE/BANDAGES/DRESSINGS) IMPLANT
ELECT REM PT RETURN 9FT ADLT (ELECTROSURGICAL)
ELECTRODE REM PT RTRN 9FT ADLT (ELECTROSURGICAL) IMPLANT
GLOVE SURG SS PI 6.5 STRL IVOR (GLOVE) IMPLANT
GOWN PREVENTION PLUS LG XLONG (DISPOSABLE) IMPLANT
NEEDLE HYPO 22GX1.5 SAFETY (NEEDLE) IMPLANT
NS IRRIG 1000ML POUR BTL (IV SOLUTION) IMPLANT
PACK ABDOMINAL MINOR (CUSTOM PROCEDURE TRAY) IMPLANT
PENCIL BUTTON HOLSTER BLD 10FT (ELECTRODE) IMPLANT
SPONGE LAP 4X18 X RAY DECT (DISPOSABLE) IMPLANT
SUT CHROMIC 2 0 SH (SUTURE) IMPLANT
SUT MONOCRYL 3-0 PS-2 UND MONO (SUTURE) IMPLANT
SUT VIC AB 0 CT2 27 (SUTURE) IMPLANT
SYR BULB IRRIGATION 50ML (SYRINGE) IMPLANT
SYR CONTROL 10ML LL (SYRINGE) IMPLANT
TOWEL OR 17X24 6PK STRL BLUE (TOWEL DISPOSABLE) IMPLANT
TRAY FOLEY CATH 14FR (SET/KITS/TRAYS/PACK) IMPLANT
WATER STERILE IRR 1000ML POUR (IV SOLUTION) IMPLANT

## 2011-09-11 NOTE — Anesthesia Preprocedure Evaluation (Addendum)
Anesthesia Evaluation  Patient identified by MRN, date of birth, ID band Patient awake    Reviewed: Allergy & Precautions, H&P , Patient's Chart, lab work & pertinent test results  Airway Mallampati: III TM Distance: >3 FB Neck ROM: full    Dental No notable dental hx. (+) Teeth Intact   Pulmonary Recent URI , Residual Cough,  Current URI on prednisone breath sounds clear to auscultation  Pulmonary exam normal       Cardiovascular negative cardio ROS  Rhythm:regular Rate:Normal     Neuro/Psych negative neurological ROS  negative psych ROS   GI/Hepatic negative GI ROS, Neg liver ROS,   Endo/Other  negative endocrine ROS  Renal/GU negative Renal ROS  negative genitourinary   Musculoskeletal   Abdominal Normal abdominal exam  (+)   Peds  Hematology negative hematology ROS (+)   Anesthesia Other Findings   Reproductive/Obstetrics (+) Pregnancy                          Anesthesia Physical Anesthesia Plan  ASA: II  Anesthesia Plan: Epidural   Post-op Pain Management:    Induction:   Airway Management Planned:   Additional Equipment:   Intra-op Plan:   Post-operative Plan:   Informed Consent: I have reviewed the patients History and Physical, chart, labs and discussed the procedure including the risks, benefits and alternatives for the proposed anesthesia with the patient or authorized representative who has indicated his/her understanding and acceptance.     Plan Discussed with: Anesthesiologist  Anesthesia Plan Comments:         Anesthesia Quick Evaluation

## 2011-09-11 NOTE — Anesthesia Procedure Notes (Signed)
Epidural Patient location during procedure: OB Start time: 09/11/2011 12:51 AM  Staffing Anesthesiologist: Cristan Scherzer A. Performed by: anesthesiologist   Preanesthetic Checklist Completed: patient identified, site marked, surgical consent, pre-op evaluation, timeout performed, IV checked, risks and benefits discussed and monitors and equipment checked  Epidural Patient position: sitting Prep: site prepped and draped and DuraPrep Patient monitoring: continuous pulse ox and blood pressure Approach: midline Injection technique: LOR air  Needle:  Needle type: Tuohy  Needle gauge: 17 G Needle length: 9 cm Needle insertion depth: 5 cm cm Catheter type: closed end flexible Catheter size: 19 Gauge Catheter at skin depth: 10 cm Test dose: negative and Other  Assessment Events: blood not aspirated, injection not painful, no injection resistance, negative IV test and no paresthesia  Additional Notes Patient identified. Risks and benefits discussed including failed block, incomplete  Pain control, post dural puncture headache, nerve damage, paralysis, blood pressure Changes, nausea, vomiting, reactions to medications-both toxic and allergic and post Partum back pain. All questions were answered. Patient expressed understanding and wished to proceed. Sterile technique was used throughout procedure. Epidural site was Dressed with sterile barrier dressing. No paresthesias, signs of intravascular injection Or signs of intrathecal spread were encountered.  Patient was more comfortable after the epidural was dosed. Please see RN's note for documentation of vital signs and FHR which are stable.

## 2011-09-11 NOTE — Progress Notes (Signed)
UR Chart review completed.  

## 2011-09-11 NOTE — Anesthesia Preprocedure Evaluation (Deleted)
Anesthesia Evaluation  Patient identified by MRN, date of birth, ID band Patient awake    Reviewed: Allergy & Precautions, H&P , Patient's Chart, lab work & pertinent test results  Airway Mallampati: III TM Distance: >3 FB Neck ROM: full    Dental No notable dental hx. (+) Teeth Intact   Pulmonary Recent URI , Residual Cough,  Current URI on prednisone breath sounds clear to auscultation  Pulmonary exam normal       Cardiovascular negative cardio ROS  Rhythm:regular Rate:Normal     Neuro/Psych negative neurological ROS  negative psych ROS   GI/Hepatic negative GI ROS, Neg liver ROS,   Endo/Other  negative endocrine ROS  Renal/GU negative Renal ROS  negative genitourinary   Musculoskeletal   Abdominal Normal abdominal exam  (+)   Peds  Hematology negative hematology ROS (+)   Anesthesia Other Findings   Reproductive/Obstetrics (+) Pregnancy (SVD at 0251)                          Anesthesia Physical  Anesthesia Plan  ASA: II  Anesthesia Plan: Epidural   Post-op Pain Management:    Induction:   Airway Management Planned:   Additional Equipment:   Intra-op Plan:   Post-operative Plan:   Informed Consent: I have reviewed the patients History and Physical, chart, labs and discussed the procedure including the risks, benefits and alternatives for the proposed anesthesia with the patient or authorized representative who has indicated his/her understanding and acceptance.     Plan Discussed with: Anesthesiologist  Anesthesia Plan Comments:         Anesthesia Quick Evaluation

## 2011-09-11 NOTE — Progress Notes (Signed)
Post Partum Day 0 Subjective: no complaints  Objective: Blood pressure 124/78, pulse 61, temperature 98.4 F (36.9 C), temperature source Oral, resp. rate 16, height 5\' 8"  (1.727 m), weight 180 lb (81.647 kg), last menstrual period 12/19/2010, SpO2 99.00%, unknown if currently breastfeeding.  Physical Exam:  General: alert, cooperative, fatigued and no distress Lochia: appropriate Uterine Fundus: firm Incision: na DVT Evaluation: No evidence of DVT seen on physical exam.   Basename 09/10/11 2345  HGB 11.3*  HCT 34.6*    Assessment/Plan: Contraception :  The pt had originally decided on postpartum tubal ligation which was scheduled for 11am today.  Pt now wants at least to delay sterilization, perhaps to tomorrow, perhaps to 6 weeks postpartum.  Surgery for today is cancelled.   LOS: 1 day   Retaj Hilbun P 09/11/2011, 1:01 PM

## 2011-09-12 ENCOUNTER — Encounter (HOSPITAL_COMMUNITY): Payer: Self-pay | Admitting: *Deleted

## 2011-09-12 LAB — CBC
HCT: 30.2 % — ABNORMAL LOW (ref 36.0–46.0)
MCHC: 32.5 g/dL (ref 30.0–36.0)
MCV: 92.1 fL (ref 78.0–100.0)
Platelets: 152 10*3/uL (ref 150–400)
RDW: 14.3 % (ref 11.5–15.5)
WBC: 8.7 10*3/uL (ref 4.0–10.5)

## 2011-09-12 MED ORDER — IBUPROFEN 600 MG PO TABS: 600.0000 mg | ORAL_TABLET | Freq: Four times a day (QID) | ORAL | Status: AC | PRN

## 2011-09-12 NOTE — Discharge Summary (Signed)
Physician Discharge Summary  Patient ID: Veronica Houston MRN: [id number] DOB/AGE: Apr 14, 1975 36 y.o.  Admit date: 09/10/2011 Discharge date: 09/12/2011  Admission Diagnoses: term pg active labor  Discharge Diagnoses:  Active Problems:  Vaginal delivery   Discharged Condition: stable  Hospital Course: term pg active labor, SVD, periurethral lac, normal involution    Consults: None  Significant Diagnostic Studies: labs:  Hemoglobin & Hematocrit     Component Value Date/Time   HGB 9.8* 09/12/2011 0535   HCT 30.2* 09/12/2011 0535     Treatments: IV hydration  Discharge Exam: Blood pressure 136/88, pulse 61, temperature 98 F (36.7 C), temperature source Oral, resp. rate 18, height 5\' 8"  (1.727 m), weight 180 lb (81.647 kg), last menstrual period 12/19/2010, SpO2 99.00%, unknown if currently breastfeeding. General appearance: alert, cooperative and no distress Calm, no distress, lungs clear bilaterally, AP RRR, abd soft nt,no masses, not tympanic no edema to lower extremities, FF sm serosa flow perineum clean intact. -Homans sign bilaterally S: comfortable, little bleeding, slept    breast feeding O VSS     A normal involution     Lactating     PP day 2 Disposition: 01-Home or Self Care   Medication List  As of 09/12/2011 12:48 PM   STOP taking these medications         predniSONE 5 MG tablet         TAKE these medications         acetaminophen 500 MG tablet   Commonly known as: TYLENOL   Take 500 mg by mouth every 6 (six) hours as needed.      hydroxychloroquine 200 MG tablet   Commonly known as: PLAQUENIL   Take 400 mg by mouth daily.      prenatal vitamin w/FE, FA 27-1 MG Tabs   Take 1 tablet by mouth daily.           Follow-up Information    Follow up with [facility] in 5 weeks.       [facility] handbook. F/o 5 weeks, plans mirena risks and benefits discussed.  SignedLavera Guise 09/12/2011, 12:48 PM

## 2011-09-12 NOTE — Discharge Instructions (Signed)
Vaginal Delivery Care After  Change your pad on each trip to the bathroom.   Wipe gently with toilet paper during your hospital stay. Always wipe from front to back. A spray bottle with warm tap water could also be used or a towelette if available.   Place your soiled pad and toilet paper in a bathroom wastebasket with a plastic bag liner.   During your hospital stay, save any clots. If you pass a clot while on the toilet, do not flush it. Also, if your vaginal flow seems excessive to you, notify nursing personnel.   The first time you get out of bed after delivery, wait for assistance from a nurse. Do not get up alone at any time if you feel weak or dizzy.   Bend and extend your ankles forcefully so that you feel the calves of your legs get hard. Do this 6 times every hour when you are in bed and awake.   Do not sit with one foot under you, dangle your legs over the edge of the bed, or maintain a position that hinders the circulation in your legs.   Many women experience after pains for 2 to 3 days after delivery. These after pains are mild uterine contractions. Ask the nurse for a pain medication if you need something for this. Sometimes breastfeeding stimulates after pains; if you find this to be true, ask for the medication  -  hour before the next feeding.   For you and your infant's protection, do not go beyond the door(s) of the obstetric unit. Do not carry your baby in your arms in the hallway. When taking your baby to and from your room, put your baby in the bassinet and push the bassinet.   Mothers may have their babies in their room as much as they desire.  Document Released: 03/06/2000 Document Revised: 02/26/2011 Document Reviewed: 02/04/2007 [facility] Patient Information 2012 [place], [place]. Breastfeeding BENEFITS OF BREASTFEEDING For the baby  The first milk (colostrum) helps the baby's digestive system function better.   There are antibodies from the mother in the milk  that help the baby fight off infections.   The baby has a lower incidence of asthma, allergies, and SIDS (sudden infant death syndrome).   The nutrients in breast milk are better than formulas for the baby and helps the baby's brain grow better.   Babies who breastfeed have less gas, colic, and constipation.  For the mother  Breastfeeding helps develop a very special bond between mother and baby.   It is more convenient, always available at the correct temperature and cheaper than formula feeding.   It burns calories in the mother and helps with losing weight that was gained during pregnancy.   It makes the uterus contract back down to normal size faster and slows bleeding following delivery.   Breastfeeding mothers have a lower risk of developing breast cancer.  NURSE FREQUENTLY  A healthy, full-term baby may breastfeed as often as every hour or space his or her feedings to every 3 hours.   How often to nurse will vary from baby to baby. Watch your baby for signs of hunger, not the clock.   Nurse as often as the baby requests, or when you feel the need to reduce the fullness of your breasts.   Awaken the baby if it has been 3 to 4 hours since the last feeding.   Frequent feeding will help the mother make more milk and will prevent problems like  sore nipples and engorgement of the breasts.  BABY'S POSITION AT THE BREAST  Whether lying down or sitting, be sure that the baby's tummy is facing your tummy.   Support the breast with 4 fingers underneath the breast and the thumb above. Make sure your fingers are well away from the nipple and baby's mouth.   Stroke the baby's lips and cheek closest to the breast gently with your finger or nipple.   When the baby's mouth is open wide enough, place all of your nipple and as much of the dark area around the nipple as possible into your baby's mouth.   Pull the baby in close so the tip of the nose and the baby's cheeks touch the breast  during the feeding.  FEEDINGS  The length of each feeding varies from baby to baby and from feeding to feeding.   The baby must suck about 2 to 3 minutes for your milk to get to him or her. This is called a "let down." For this reason, allow the baby to feed on each breast as long as he or she wants. Your baby will end the feeding when he or she has received the right balance of nutrients.   To break the suction, put your finger into the corner of the baby's mouth and slide it between his or her gums before removing your breast from his or her mouth. This will help prevent sore nipples.  REDUCING BREAST ENGORGEMENT  In the first week after your baby is born, you may experience signs of breast engorgement. When breasts are engorged, they feel heavy, warm, full, and may be tender to the touch. You can reduce engorgement if you:   Nurse frequently, every 2 to 3 hours. Mothers who breastfeed early and often have fewer problems with engorgement.   Place light ice packs on your breasts between feedings. This reduces swelling. Wrap the ice packs in a lightweight towel to protect your skin.   Apply moist hot packs to your breast for 5 to 10 minutes before each feeding. This increases circulation and helps the milk flow.   Gently massage your breast before and during the feeding.   Make sure that the baby empties at least one breast at every feeding before switching sides.   Use a breast pump to empty the breasts if your baby is sleepy or not nursing well. You may also want to pump if you are returning to work or or you feel you are getting engorged.   Avoid bottle feeds, pacifiers or supplemental feedings of water or juice in place of breastfeeding.   Be sure the baby is latched on and positioned properly while breastfeeding.   Prevent fatigue, stress, and anemia.   Wear a supportive bra, avoiding underwire styles.   Eat a balanced diet with enough fluids.  If you follow these suggestions,  your engorgement should improve in 24 to 48 hours. If you are still experiencing difficulty, call your lactation consultant or caregiver. IS MY BABY GETTING ENOUGH MILK? Sometimes, mothers worry about whether their babies are getting enough milk. You can be assured that your baby is getting enough milk if:  The baby is actively sucking and you hear swallowing.   The baby nurses at least 8 to 12 times in a 24 hour time period. Nurse your baby until he or she unlatches or falls asleep at the first breast (at least 10 to 20 minutes), then offer the second side.   The baby  is wetting 5 to 6 disposable diapers (6 to 8 cloth diapers) in a 24 hour period by 22 to 57 days of age.   The baby is having at least 2 to 3 stools every 24 hours for the first few months. Breast milk is all the food your baby needs. It is not necessary for your baby to have water or formula. In fact, to help your breasts make more milk, it is best not to give your baby supplemental feedings during the early weeks.   The stool should be soft and yellow.   The baby should gain 4 to 7 ounces per week after he is 26 days old.  TAKE CARE OF YOURSELF Take care of your breasts by:  Bathing or showering daily.   Avoiding the use of soaps on your nipples.   Start feedings on your left breast at one feeding and on your right breast at the next feeding.   You will notice an increase in your milk supply 2 to 5 days after delivery. You may feel some discomfort from engorgement, which makes your breasts very firm and often tender. Engorgement "peaks" out within 24 to 48 hours. In the meantime, apply warm moist towels to your breasts for 5 to 10 minutes before feeding. Gentle massage and expression of some milk before feeding will soften your breasts, making it easier for your baby to latch on. Wear a well fitting nursing bra and air dry your nipples for 10 to 15 minutes after each feeding.   Only use cotton bra pads.   Only use pure  lanolin on your nipples after nursing. You do not need to wash it off before nursing.  Take care of yourself by:   Eating well-balanced meals and nutritious snacks.   Drinking milk, fruit juice, and water to satisfy your thirst (about 8 glasses a day).   Getting plenty of rest.   Increasing calcium in your diet (1200 mg a day).   Avoiding foods that you notice affect the baby in a bad way.  SEEK MEDICAL CARE IF:   You have any questions or difficulty with breastfeeding.   You need help.   You have a hard, red, sore area on your breast, accompanied by a fever of 100.5 F (38.1 C) or more.   Your baby is too sleepy to eat well or is having trouble sleeping.   Your baby is wetting less than 6 diapers per day, by 71 days of age.   Your baby's skin or white part of his or her eyes is more yellow than it was in the hospital.   You feel depressed.  Document Released: 03/09/2005 Document Revised: 02/26/2011 Document Reviewed: 10/22/2008 [facility] Patient Information 2012 [place], [place].

## 2011-09-12 NOTE — Anesthesia Postprocedure Evaluation (Signed)
  Anesthesia Post-op Note  Patient: Veronica Houston  Procedure(s) Performed: * No procedures listed *  Patient Location: Mother/Baby  Anesthesia Type: Epidural  Level of Consciousness: awake, alert  and oriented  Airway and Oxygen Therapy: Patient Spontanous Breathing  Post-op Pain: none  Post-op Assessment: Post-op Vital signs reviewed, Patient's Cardiovascular Status Stable, No headache, No backache, No residual numbness and No residual motor weakness  Post-op Vital Signs: Reviewed and stable  Complications: No apparent anesthesia complications

## 2011-09-14 ENCOUNTER — Encounter (HOSPITAL_COMMUNITY): Payer: Self-pay | Admitting: Obstetrics and Gynecology

## 2011-09-14 ENCOUNTER — Inpatient Hospital Stay (HOSPITAL_COMMUNITY): Admission: RE | Admit: 2011-09-14 | Payer: Medicaid Other | Source: Ambulatory Visit

## 2011-09-15 ENCOUNTER — Telehealth: Payer: Self-pay | Admitting: Obstetrics and Gynecology

## 2011-09-15 NOTE — Telephone Encounter (Signed)
TC from pt.  S/P delivery 09/11/11. Was D/C'd  09/12/11.   Had been on Prednisone for cough but discharge instructions were to D/C.  Since 09/12/11 cough has increased and is now short of breath, which is audible on the phone. 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 Pt is in [place].   Per [facility], pt advised to cal MD who manages condition or be seen at [facility] or ER.   Pt states has already contacted MD, but was told since was originally prescribed by Dr SR, to f/u with [place]. Pt states has been taking x 3 months and is only med that has helped cough.  Questioning if can just restart but advised needs eval first.  Consult again with [facility] who will call pt directly.

## 2011-09-15 NOTE — Telephone Encounter (Signed)
Nancy/epic

## 2011-09-17 ENCOUNTER — Encounter: Payer: Medicaid Other | Admitting: Obstetrics and Gynecology

## 2011-09-17 ENCOUNTER — Telehealth: Payer: Self-pay | Admitting: Obstetrics and Gynecology

## 2011-09-17 NOTE — Telephone Encounter (Signed)
TC from patient--38 weeks, SROM at 9pm, ? Yellow/green, +FM, G2P0 No contractions at present.  Come to [facility].

## 2011-10-08 ENCOUNTER — Encounter (HOSPITAL_COMMUNITY): Payer: Self-pay | Admitting: Pharmacist

## 2011-10-13 ENCOUNTER — Encounter (HOSPITAL_COMMUNITY)
Admission: RE | Admit: 2011-10-13 | Discharge: 2011-10-13 | Disposition: A | Discharge status: Home / Self Care | Payer: Medicaid Other | Source: Ambulatory Visit | Attending: Obstetrics and Gynecology | Admitting: Obstetrics and Gynecology

## 2011-10-13 ENCOUNTER — Encounter (HOSPITAL_COMMUNITY): Payer: Self-pay

## 2011-10-13 DIAGNOSIS — Z01818 Encounter for other preprocedural examination: Secondary | ICD-10-CM | POA: Insufficient documentation

## 2011-10-13 DIAGNOSIS — Z01812 Encounter for preprocedural laboratory examination: Secondary | ICD-10-CM | POA: Insufficient documentation

## 2011-10-13 LAB — SURGICAL PCR SCREEN: Staphylococcus aureus: NEGATIVE

## 2011-10-13 LAB — CBC
HCT: 37.6 % (ref 36.0–46.0)
MCHC: 31.6 g/dL (ref 30.0–36.0)
MCV: 92.8 fL (ref 78.0–100.0)
Platelets: 204 10*3/uL (ref 150–400)
RDW: 13.4 % (ref 11.5–15.5)
WBC: 4.4 10*3/uL (ref 4.0–10.5)

## 2011-10-13 NOTE — Patient Instructions (Addendum)
YOUR PROCEDURE IS SCHEDULED ON:10/22/11  ENTER THROUGH THE MAIN ENTRANCE OF [facility] HOS[facility]ITAL AT: 1PM  USE DESK PHONE AND DIAL [phone] TO INFORM us OF YOUR ARRIVAL  CALL [phone] IF YOU HAVE ANY QUESTIONS OR PROBLEMS PRIOR TO YOUR ARRIVAL.  REMEMBER: DO NOT EAT AFTER MIDNIGHT :WED   SPECIAL INSTRUCTIONS:CLEAR LIQUIDS OK UNTIL 1030AM  On Thursday- REFER TO CLEAR LIQUIDS SHEET   YOU MAY BRUSH YOUR TEETH THE MORNING OF SURGERY   TAKE THESE MEDICINES THE MORNING OF SURGERY WITH SIP OF WATER: none   DO NOT WEAR JEWELRY, EYE MAKEUP, LIPSTICK OR DARK FINGERNAIL POLISH DO NOT WEAR LOTIONS  DO NOT SHAVE FOR 48 HOURS PRIOR TO SURGERY  YOU WILL NOT BE ALLOWED TO DRIVE YOURSELF HOME.

## 2011-10-14 ENCOUNTER — Telehealth: Payer: Self-pay | Admitting: Obstetrics and Gynecology

## 2011-10-19 ENCOUNTER — Other Ambulatory Visit: Payer: Self-pay | Admitting: Obstetrics and Gynecology

## 2011-10-19 ENCOUNTER — Telehealth: Payer: Self-pay | Admitting: Obstetrics and Gynecology

## 2011-10-19 NOTE — Telephone Encounter (Signed)
Laparoscopic Tubal Cautery Scheduled for 10/30/11 @ 11:30 with [facility].  Patient has MCD. -Adrianne Pridgen

## 2011-10-19 NOTE — OR Nursing (Signed)
Moved per [employer]

## 2011-10-26 ENCOUNTER — Ambulatory Visit (INDEPENDENT_AMBULATORY_CARE_PROVIDER_SITE_OTHER): Payer: Medicaid Other | Admitting: Obstetrics and Gynecology

## 2011-10-26 ENCOUNTER — Encounter: Payer: Self-pay | Admitting: Obstetrics and Gynecology

## 2011-10-26 NOTE — Progress Notes (Signed)
Date of delivery: 09/11/2011 Female Name: Veronica Houston Vaginal delivery:yes Cesarean section:no Tubal ligation:Considering GDM:no Breast Feeding:yes Bottle Feeding:no Post-Partum Blues:no Abnormal pap:no Normal GU function: yes Normal GI function:yes Returning to work:yes EPDS: 3  Veronica Houston is an 36 y.o. female. She is followed at [facility], and desires surgical sterilization. 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  She has done well postpartum. She continues to have some brownish discharge but no abdominal pain. She continues breast-feeding. She has not been sexually active.  Pertinent Gynecological History: Menses: regular every month without intermenstrual spotting Bleeding: no abnormal uterine bleeding Contraception: abstinence DES exposure: denies Blood transfusions: none Sexually transmitted diseases: no past history Previous GYN Procedures: none  Last mammogram: not applicable Date: n/a Last pap: normal Date: 03/16/11 OB History: G1, P1   Menstrual History: Menarche age:33 Patient's last menstrual period was 11/13/2010.    Past Medical History  Diagnosis Date  . Yeast infection   . H/O bacterial infection   . Cystitis   . UTI (lower urinary tract infection) 01/2011  . Scleroderma Oct 2012    Past Surgical History  Procedure Date  . No past surgeries     Family History  Problem Relation Age of Onset  . Hypertension Mother   . Hypertension Father   . Hypertension Maternal Grandfather   . Hypertension Paternal Grandmother   . Kidney disease Paternal Grandmother   . Hypertension Paternal Grandfather   . Cancer Paternal Grandfather   . Other Cousin     died from too much anesthesia    Social History:  reports that she has never smoked. She has never used smokeless tobacco. She reports that she does not drink alcohol or use illicit drugs.  Allergies:  Allergies   Allergen Reactions  . Sulfa Antibiotics     Throat burning     (Not in a hospital admission)  @ROS @  Blood pressure 104/64, pulse 62, temperature 98 F (36.7 C), temperature source Oral, resp. rate 16, height 5\' 8"  (1.727 m), weight 166 lb (75.297 kg), last menstrual period 11/13/2010, currently breastfeeding.  HEENT; Thyroid normal   Malar facial erythema Lungs:  Clear Heart:  Regular rate and rhythm without murmur Abdomen: Soft without masses or organomegaly Pelvic:  Vulvar: Nl   Vagina: Nl   Cervix:: Small amount of mucoid blood from the os   Uterus: Normal size shape and consistency anterior mobile   Adnexa: No masses   Assessment/Plan: Desires surgical sterilization Scleroderma  Plan: Laparoscopic tubal cautery.  Patient desires permanent sterilization.  Other reversible forms of contraception were discussed with patient; she declines all other modalities. Risks of procedure discussed with patient including but not limited to: risk of regret, permanence of method, bleeding, infection, injury to surrounding organs and need for additional procedures.  Failure risk of 0.5-1% with increased risk of ectopic gestation if pregnancy occurs was also discussed with patient.  Patient verbalized understanding of these risks and wants to proceed with sterilization.  Marland Kitchen  Yaret Hush P 10/26/2011, 7:49 PM

## 2011-10-29 NOTE — H&P (Signed)
Veronica Houston is an 36 y.o. female. She is followed at [facility], and desires surgical sterilization. 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  She has done well postpartum. She continues to have some brownish discharge but no abdominal pain. She continues breast-feeding. She has not been sexually active.   Pertinent Gynecological History: Menses: regular every month without intermenstrual spotting Bleeding: no abnormal uterine bleeding Contraception: abstinence DES exposure: denies Blood transfusions: none Sexually transmitted diseases: no past history Previous GYN Procedures: none   Last mammogram: not applicable Date: n/a Last pap: normal Date: 03/16/11 OB History: G1, P1        Menstrual History: Menarche age:65 Patient's last menstrual period was 11/13/2010.      Past Medical History   Diagnosis  Date   .  Yeast infection     .  H/O bacterial infection     .  Cystitis     .  UTI (lower urinary tract infection)  01/2011   .  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  No past surgeries           Family History   Problem  Relation  Age of Onset   .  Hypertension  Mother     .  Hypertension  Father     .  Hypertension  Maternal Grandfather     .  Hypertension  Paternal Grandmother     .  Kidney disease  Paternal Grandmother     .  Hypertension  Paternal Grandfather     .  Cancer  Paternal Grandfather     .  Other  Cousin         died from too much anesthesia      Social History:  reports that she has never smoked. She has never used smokeless tobacco. She reports that she does not drink alcohol or use illicit drugs.   Allergies:  Allergies   Allergen  Reactions   .  Sulfa Antibiotics         Throat burning    ROS;  Positive for recent onset nonproductive cough.  Evaluation by Rheumatologist with chest CT showed per patient,  some scarring at the base of the lungs.  ECHO result was pending at the time of this visit, per patient     Blood pressure 104/64, pulse 62, temperature 98 F (36.7 C), temperature source Oral, resp. rate 16, height 5\' 8"  (1.727 m), weight 166 lb (75.297 kg), last menstrual period 11/13/2010, currently breastfeeding. BP 110/72  Pulse 80  Temp 98.6 F (37 C) (Oral)  Resp 16  SpO2 99%  LMP 11/30/2010  Breastfeeding? Yes    HEENT;           Thyroid normal                         Malar facial erythema Lungs:             Clear Heart:              Regular rate and rhythm without murmur Abdomen:        Soft without masses or organomegaly Pelvic:             Vulvar: Nl  Vagina: Nl                         Cervix:: Small amount of mucoid blood from the os                         Uterus: Normal size shape and consistency anterior mobile                         Adnexa: No masses   UPT neg  Assessment/Plan: Desires surgical sterilization Scleroderma   Plan: Laparoscopic tubal cautery.  Patient desires permanent sterilization.  Other reversible forms of contraception were discussed with patient; she declines all other modalities. Risks of procedure discussed with patient including but not limited to: risk of regret, permanence of method, bleeding, infection, injury to surrounding organs and need for additional procedures.  Failure risk of 0.5-1% with increased risk of ectopic gestation if pregnancy occurs was also discussed with patient.  Patient verbalized understanding of these risks and wants to proceed with sterilization.  Marland Kitchen   Abiel Antrim P

## 2011-10-30 ENCOUNTER — Encounter (HOSPITAL_COMMUNITY)
Admission: RE | Disposition: A | Discharge status: Home / Self Care | Payer: Self-pay | Source: Ambulatory Visit | Attending: Obstetrics and Gynecology

## 2011-10-30 ENCOUNTER — Ambulatory Visit (HOSPITAL_COMMUNITY)
Admission: RE | Admit: 2011-10-30 | Discharge: 2011-10-30 | Disposition: A | Discharge status: Home / Self Care | Payer: Medicaid Other | Source: Ambulatory Visit | Attending: Obstetrics and Gynecology | Admitting: Obstetrics and Gynecology

## 2011-10-30 ENCOUNTER — Encounter (HOSPITAL_COMMUNITY): Payer: Self-pay | Admitting: Anesthesiology

## 2011-10-30 ENCOUNTER — Encounter (HOSPITAL_COMMUNITY): Payer: Self-pay | Admitting: *Deleted

## 2011-10-30 ENCOUNTER — Ambulatory Visit (HOSPITAL_COMMUNITY): Payer: Medicaid Other | Admitting: Anesthesiology

## 2011-10-30 DIAGNOSIS — Z01818 Encounter for other preprocedural examination: Secondary | ICD-10-CM | POA: Insufficient documentation

## 2011-10-30 DIAGNOSIS — R05 Cough: Secondary | ICD-10-CM | POA: Insufficient documentation

## 2011-10-30 DIAGNOSIS — Z01812 Encounter for preprocedural laboratory examination: Secondary | ICD-10-CM | POA: Insufficient documentation

## 2011-10-30 DIAGNOSIS — R059 Cough, unspecified: Secondary | ICD-10-CM | POA: Insufficient documentation

## 2011-10-30 DIAGNOSIS — Z302 Encounter for sterilization: Secondary | ICD-10-CM

## 2011-10-30 HISTORY — PX: LAPAROSCOPIC TUBAL LIGATION: SHX1937

## 2011-10-30 SURGERY — LIGATION, FALLOPIAN TUBE, LAPAROSCOPIC
Anesthesia: General | Site: Abdomen | Laterality: Bilateral | Wound class: Clean Contaminated

## 2011-10-30 MED ORDER — KETOROLAC TROMETHAMINE 15 MG/ML IJ SOLN
INTRAMUSCULAR | Status: DC | PRN
  Administered 2011-10-30: 45 mg via INTRAVENOUS

## 2011-10-30 MED ORDER — NEOSTIGMINE METHYLSULFATE 1 MG/ML IJ SOLN
INTRAMUSCULAR | Status: DC | PRN
  Administered 2011-10-30: 5 mg via INTRAVENOUS

## 2011-10-30 MED ORDER — DEXAMETHASONE SODIUM PHOSPHATE 10 MG/ML IJ SOLN
INTRAMUSCULAR | Status: DC | PRN
  Administered 2011-10-30: 10 mg via INTRAVENOUS

## 2011-10-30 MED ORDER — GLYCOPYRROLATE 0.2 MG/ML IJ SOLN
INTRAMUSCULAR | Status: DC | PRN
  Administered 2011-10-30: .8 mg via INTRAVENOUS

## 2011-10-30 MED ORDER — ONDANSETRON HCL 4 MG/2ML IJ SOLN
INTRAMUSCULAR | Status: DC | PRN
  Administered 2011-10-30: 4 mg via INTRAVENOUS

## 2011-10-30 MED ORDER — MIDAZOLAM HCL 2 MG/2ML IJ SOLN
INTRAMUSCULAR | Status: AC
  Filled 2011-10-30: qty 2

## 2011-10-30 MED ORDER — FENTANYL CITRATE 0.05 MG/ML IJ SOLN
INTRAMUSCULAR | Status: AC
  Filled 2011-10-30: qty 5

## 2011-10-30 MED ORDER — MIDAZOLAM HCL 5 MG/5ML IJ SOLN
INTRAMUSCULAR | Status: DC | PRN
  Administered 2011-10-30: 2 mg via INTRAVENOUS

## 2011-10-30 MED ORDER — ROCURONIUM BROMIDE 100 MG/10ML IV SOLN
INTRAVENOUS | Status: DC | PRN
  Administered 2011-10-30: 40 mg via INTRAVENOUS
  Administered 2011-10-30: 10 mg via INTRAVENOUS

## 2011-10-30 MED ORDER — PROPOFOL 10 MG/ML IV EMUL
INTRAVENOUS | Status: DC | PRN
  Administered 2011-10-30: 200 mg via INTRAVENOUS

## 2011-10-30 MED ORDER — BUPIVACAINE HCL (PF) 0.25 % IJ SOLN
INTRAMUSCULAR | Status: DC | PRN
  Administered 2011-10-30: 8 mL

## 2011-10-30 MED ORDER — NEOSTIGMINE METHYLSULFATE 1 MG/ML IJ SOLN
INTRAMUSCULAR | Status: AC
  Filled 2011-10-30: qty 10

## 2011-10-30 MED ORDER — FENTANYL CITRATE 0.05 MG/ML IJ SOLN
INTRAMUSCULAR | Status: DC | PRN
  Administered 2011-10-30: 200 ug via INTRAVENOUS
  Administered 2011-10-30: 50 ug via INTRAVENOUS

## 2011-10-30 MED ORDER — FENTANYL CITRATE 0.05 MG/ML IJ SOLN
25.0000 ug | INTRAMUSCULAR | Status: DC | PRN
  Administered 2011-10-30 (×2): 50 ug via INTRAVENOUS

## 2011-10-30 MED ORDER — DEXAMETHASONE SODIUM PHOSPHATE 10 MG/ML IJ SOLN
INTRAMUSCULAR | Status: AC
  Filled 2011-10-30: qty 1

## 2011-10-30 MED ORDER — IBUPROFEN 600 MG PO TABS: ORAL_TABLET | ORAL | Status: DC

## 2011-10-30 MED ORDER — HYDROCODONE-ACETAMINOPHEN 5-500 MG PO TABS: 1.0000 | ORAL_TABLET | Freq: Four times a day (QID) | ORAL | Status: AC | PRN

## 2011-10-30 MED ORDER — BUPIVACAINE HCL (PF) 0.25 % IJ SOLN
INTRAMUSCULAR | Status: AC
  Filled 2011-10-30: qty 30

## 2011-10-30 MED ORDER — LIDOCAINE HCL (CARDIAC) 20 MG/ML IV SOLN
INTRAVENOUS | Status: DC | PRN
  Administered 2011-10-30: 60 mg via INTRAVENOUS

## 2011-10-30 MED ORDER — ONDANSETRON HCL 4 MG/2ML IJ SOLN
INTRAMUSCULAR | Status: AC
  Filled 2011-10-30: qty 2

## 2011-10-30 MED ORDER — FENTANYL CITRATE 0.05 MG/ML IJ SOLN
INTRAMUSCULAR | Status: AC
  Administered 2011-10-30: 50 ug via INTRAVENOUS
  Filled 2011-10-30: qty 2

## 2011-10-30 MED ORDER — KETOROLAC TROMETHAMINE 30 MG/ML IJ SOLN: 15.0000 mg | Freq: Once | INTRAMUSCULAR | Status: DC | PRN

## 2011-10-30 MED ORDER — PROPOFOL 10 MG/ML IV EMUL
INTRAVENOUS | Status: AC
  Filled 2011-10-30: qty 20

## 2011-10-30 MED ORDER — LACTATED RINGERS IV SOLN
INTRAVENOUS | Status: DC
Start: 2011-10-30 — End: 2011-10-30
  Administered 2011-10-30: 12:00:00 via INTRAVENOUS

## 2011-10-30 MED ORDER — LIDOCAINE HCL (CARDIAC) 20 MG/ML IV SOLN
INTRAVENOUS | Status: AC
  Filled 2011-10-30: qty 5

## 2011-10-30 MED ORDER — PANTOPRAZOLE SODIUM 40 MG PO TBEC
DELAYED_RELEASE_TABLET | ORAL | Status: AC
  Filled 2011-10-30: qty 1

## 2011-10-30 MED ORDER — MEPERIDINE HCL 25 MG/ML IJ SOLN: 6.2500 mg | INTRAMUSCULAR | Status: DC | PRN

## 2011-10-30 MED ORDER — LACTATED RINGERS IV SOLN
INTRAVENOUS | Status: DC | PRN
  Administered 2011-10-30 (×2): via INTRAVENOUS

## 2011-10-30 MED ORDER — PANTOPRAZOLE SODIUM 40 MG PO TBEC: 40.0000 mg | DELAYED_RELEASE_TABLET | Freq: Once | ORAL | Status: DC

## 2011-10-30 MED ORDER — ONDANSETRON HCL 4 MG/2ML IJ SOLN: 4.0000 mg | Freq: Once | INTRAMUSCULAR | Status: DC | PRN

## 2011-10-30 MED ORDER — GLYCOPYRROLATE 0.2 MG/ML IJ SOLN
INTRAMUSCULAR | Status: AC
  Filled 2011-10-30: qty 1

## 2011-10-30 SURGICAL SUPPLY — 18 items
CATH ROBINSON RED A/P 16FR (CATHETERS) ×2 IMPLANT
CHLORAPREP W/TINT 26ML (MISCELLANEOUS) ×2 IMPLANT
CLOTH BEACON ORANGE TIMEOUT ST (SAFETY) ×2 IMPLANT
DERMABOND ADVANCED (GAUZE/BANDAGES/DRESSINGS) ×2
DERMABOND ADVANCED .7 DNX12 (GAUZE/BANDAGES/DRESSINGS) ×2 IMPLANT
GLOVE SURG SS PI 6.5 STRL IVOR (GLOVE) ×4 IMPLANT
GOWN PREVENTION PLUS LG XLONG (DISPOSABLE) ×4 IMPLANT
PACK LAPAROSCOPY BASIN (CUSTOM PROCEDURE TRAY) ×2 IMPLANT
STRIP CLOSURE SKIN 1/4X3 (GAUZE/BANDAGES/DRESSINGS) IMPLANT
SUT VIC AB 3-0 PS2 18 (SUTURE)
SUT VIC AB 3-0 PS2 18XBRD (SUTURE) IMPLANT
SUT VICRYL 0 UR6 27IN ABS (SUTURE) ×2 IMPLANT
TOWEL OR 17X24 6PK STRL BLUE (TOWEL DISPOSABLE) ×4 IMPLANT
TROCAR BALL TOP DISP 5MM (ENDOMECHANICALS) IMPLANT
TROCAR Z-THREAD BLADED 11X100M (TROCAR) ×2 IMPLANT
TROCAR Z-THREAD BLADED 5X100MM (TROCAR) IMPLANT
WARMER LAPAROSCOPE (MISCELLANEOUS) ×2 IMPLANT
WATER STERILE IRR 1000ML POUR (IV SOLUTION) ×2 IMPLANT

## 2011-10-30 NOTE — Transfer of Care (Signed)
Immediate Anesthesia Transfer of Care Note  Patient: Veronica Houston  Procedure(s) Performed: Procedure(s) (LRB): LAPAROSCOPIC TUBAL LIGATION (Bilateral)  Patient Location: PACU  Anesthesia Type: General  Level of Consciousness: awake, alert  and oriented  Airway & Oxygen Therapy: Patient Spontanous Breathing and Patient connected to nasal cannula oxygen  Post-op Assessment: Report given to PACU RN and Post -op Vital signs reviewed and stable  Post vital signs: Reviewed and stable  Complications: No apparent anesthesia complications

## 2011-10-30 NOTE — Addendum Note (Signed)
Addendum  created 10/30/11 1434 by Sylar Voong R Walker, CRNA   Modules edited:Anesthesia Medication Administration    

## 2011-10-30 NOTE — Anesthesia Procedure Notes (Addendum)
Procedure Name: Intubation Date/Time: 10/30/2011 12:10 PM Performed by: Isabella Bowens R Pre-anesthesia Checklist: Patient identified, Emergency Drugs available, Suction available, Patient being monitored and Timeout performed Patient Re-evaluated:Patient Re-evaluated prior to inductionOxygen Delivery Method: Circle system utilized Preoxygenation: Pre-oxygenation with 100% oxygen Intubation Type: IV induction Ventilation: Mask ventilation without difficulty Laryngoscope Size: Mac and 3 Grade View: Grade I Tube size: 7.0 mm Number of attempts: 2 Placement Confirmation: ETT inserted through vocal cords under direct vision,  positive ETCO2 and breath sounds checked- equal and bilateral Secured at: 24 cm Tube secured with: Tape Dental Injury: Teeth and Oropharynx as per pre-operative assessment

## 2011-10-30 NOTE — Addendum Note (Signed)
Addendum  created 10/30/11 1434 by Gertie Fey, CRNA   Modules edited:Anesthesia Medication Administration

## 2011-10-30 NOTE — Op Note (Signed)
Laparoscopic tubal cautery Procedure Note  Indications: The patient is a 36 y.o. female   Pre-operative Diagnosis:desires surgical sterilization  Post-operative Diagnosis: same  Surgeon: Dierdre Forth P   Assistants: none  Anesthesia: General endotracheal anesthesia  ASA Class: 3  Procedure Details  The patient was seen in the Holding Room. The risks, benefits, complications, treatment options, and expected outcomes were discussed with the patient. The possibilities of reaction to medication, pulmonary aspiration, perforation of viscus, bleeding, recurrent infection, the need for additional procedures, failure to diagnose a condition, and creating a complication requiring transfusion or operation were discussed with the patient. The patient concurred with the proposed plan, giving informed consent. The patient was taken to the Operating Room, identified as Veronica Houston and the procedure verified as laparoscopic tubal cautery. A Time Out was held and the above information confirmed.  After induction of general anesthesia, the patient was placed in modified dorsal lithotomy position where she was prepped, draped, and catheterized in the normal, sterile fashion.  The cervix was visualized and an single-toothed tenaculum was placed. Subumbilical injection of 4% Marcaine was undertaken a total of 8 cc was used. A 10 mm umbilical incision was then performed. Veress needle was passed and pneumoperitoneum was established.   The laparoscopic trocar was then placed through that incision into the peritoneal cavity and the laparoscope placed through the trocar sleeve.The above findings were noted. The right fallopian tube was then identified, followed to its fimbriated end, then grasped at the isthmic portion and cauterized into adjacent areas. A similar procedure was carried out on the opposite side. Hemostasis was noted to be adequate.  Following the procedure the umbilical sheath was removed under  direct visualization after intra-abdominal carbon dioxide was expressed. The incision was closed with a fascial suture of 0 Vicryl in a figure-of-eight stitch. Dermabond was placed on the skin incision The intrauterine manipulator was then removed.  Instrument, sponge, and needle counts were correct prior to abdominal closure and at the conclusion of the case.   Findings: The anterior cul-de-sac and round ligaments normal The uterus normal sized no lesions The adnexa ovaries appeared normal bilaterally   Estimated Blood Loss:  Minimal         Drains: none         Total IV Fluids: 1000 cc         Specimens: none           Complications:  None; patient tolerated the procedure well.         Disposition: PACU - hemodynamically stable.         Condition: stable

## 2011-10-30 NOTE — Anesthesia Postprocedure Evaluation (Signed)
Anesthesia Post Note  Patient: Veronica Houston  Procedure(s) Performed: Procedure(s) (LRB): LAPAROSCOPIC TUBAL LIGATION (Bilateral)  Anesthesia type: General  Patient location: PACU  Post pain: Pain level controlled  Post assessment: Post-op Vital signs reviewed  Last Vitals:  Filed Vitals:   10/30/11 1330  BP:   Pulse: 50  Temp:   Resp: 14    Post vital signs: Reviewed  Level of consciousness: sedated  Complications: No apparent anesthesia complicationsfj

## 2011-10-30 NOTE — Anesthesia Preprocedure Evaluation (Signed)
Anesthesia Evaluation  Patient identified by MRN, date of birth, ID band Patient awake    Reviewed: Allergy & Precautions, H&P , NPO status , Patient's Chart, lab work & pertinent test results  Airway Mallampati: I TM Distance: >3 FB Neck ROM: full    Dental No notable dental hx. (+) Teeth Intact   Pulmonary neg pulmonary ROS,    Pulmonary exam normal       Cardiovascular negative cardio ROS      Neuro/Psych negative neurological ROS  negative psych ROS   GI/Hepatic negative GI ROS, Neg liver ROS,   Endo/Other  negative endocrine ROS  Renal/GU negative Renal ROS  negative genitourinary   Musculoskeletal negative musculoskeletal ROS (+)   Abdominal Normal abdominal exam  (+)   Peds negative pediatric ROS (+)  Hematology negative hematology ROS (+)   Anesthesia Other Findings   Reproductive/Obstetrics negative OB ROS                           Anesthesia Physical Anesthesia Plan  ASA: II  Anesthesia Plan: General   Post-op Pain Management:    Induction: Intravenous  Airway Management Planned: Oral ETT  Additional Equipment:   Intra-op Plan:   Post-operative Plan:   Informed Consent: I have reviewed the patients History and Physical, chart, labs and discussed the procedure including the risks, benefits and alternatives for the proposed anesthesia with the patient or authorized representative who has indicated his/her understanding and acceptance.   Dental Advisory Given  Plan Discussed with: CRNA and Surgeon  Anesthesia Plan Comments:         Anesthesia Quick Evaluation

## 2011-11-02 ENCOUNTER — Encounter (HOSPITAL_COMMUNITY): Payer: Self-pay | Admitting: Obstetrics and Gynecology

## 2011-11-12 ENCOUNTER — Encounter: Payer: Self-pay | Admitting: Obstetrics and Gynecology

## 2011-11-12 ENCOUNTER — Ambulatory Visit (INDEPENDENT_AMBULATORY_CARE_PROVIDER_SITE_OTHER): Payer: Medicaid Other | Admitting: Obstetrics and Gynecology

## 2011-11-12 VITALS — BP 102/62 | HR 66 | Temp 98.0°F | Resp 16 | Ht 68.0 in | Wt 159.0 lb

## 2011-11-12 DIAGNOSIS — Z309 Encounter for contraceptive management, unspecified: Secondary | ICD-10-CM

## 2011-11-12 MED ORDER — NORETHINDRONE ACET-ETHINYL EST 1-20 MG-MCG PO TABS: 1.0000 | ORAL_TABLET | Freq: Every day | ORAL | Status: DC

## 2011-11-12 NOTE — Progress Notes (Signed)
Post-Op Lap Tubal  Subjective:   DATE OF SURGERY: 10/30/2011 TYPE OF SURGERY: Lap Tubal  PAIN:No VAG BLEEDING: no VAG DISCHARGE: yes brownish no odor.  Just like postpartum NORMAL GI FUNCTN: yes NORMAL GU FUNCTN: yes    Veronica Houston is a 36 y.o. female who presents for post-op visit.  No co/o, still nursing.  No menses yet.  Has used BCPs in past for heavy menses, so would like to restart these when menses resume if they remain heavy.   The following portions of the patient's history were reviewed and updated as appropriate: allergies, current medications, past family history, past medical history, past social history, past surgical history and problem list.  Review of Systems Pertinent items are noted in HPI.   Objective:    BP 102/62  Pulse 66  Temp 98 F (36.7 C) (Oral)  Resp 16  Ht 5\' 8"  (1.727 m)  Wt 159 lb (72.122 kg)  BMI 24.18 kg/m2  Breastfeeding? Yes Weight:  Wt Readings from Last 1 Encounters:  11/12/11 159 lb (72.122 kg)    BMI: Body mass index is 24.18 kg/(m^2).  General Appearance: Alert, appropriate appearance for age. No acute distress Lungs: clear to auscultation bilaterally Back: No CVA tenderness Cardiovascular: Regular rate and rhythm. S1, S2, no murmur Gastrointestinal: Soft, non-tender, no masses or organomegaly Incision/s: healing well Pelvic Exam:EGBUS:  wnl        Vagina: nl   Bimanual exam normal  Assessment:    Doing well postoperatively. Operative findings again reviewed.   Plan:    1. Continue any current medications. 2. Wound care discussed. 3. Activity restrictions: none 4. Anticipated return to work: now. 5.  F/u 1 yr for aex  Dierdre Forth, MD 8/22/201311:07 AM

## 2011-11-16 ENCOUNTER — Other Ambulatory Visit: Payer: Self-pay | Admitting: Obstetrics and Gynecology

## 2011-11-16 NOTE — Telephone Encounter (Signed)
Tc to pt per telephone call. Pt given information to contact lactation consultant @ [facility] rgdg increasing milk supply. Told pt to cb if needed. Pt voices understanding.

## 2011-11-16 NOTE — Telephone Encounter (Signed)
CHANDRA/VPH PT °

## 2011-12-16 ENCOUNTER — Telehealth: Payer: Self-pay | Admitting: Obstetrics and Gynecology

## 2011-12-16 ENCOUNTER — Other Ambulatory Visit: Payer: Self-pay

## 2011-12-16 MED ORDER — NORETHIN ACE-ETH ESTRAD-FE 1-20 MG-MCG PO TABS: 1.0000 | ORAL_TABLET | Freq: Every day | ORAL | Status: DC

## 2011-12-16 NOTE — Telephone Encounter (Signed)
Tc to pt per telephone call. Pt states,"rx for Microgestin 1/20 was sent to pharm and pt takes Microgestin FE. Rx for Microgestin FE e-pres to pharm on file. Pt agrees.

## 2013-02-22 ENCOUNTER — Encounter (HOSPITAL_COMMUNITY): Payer: Self-pay

## 2013-02-22 ENCOUNTER — Encounter (HOSPITAL_COMMUNITY): Payer: Self-pay | Admitting: *Deleted

## 2013-02-22 ENCOUNTER — Ambulatory Visit (HOSPITAL_COMMUNITY)
Admission: RE | Admit: 2013-02-22 | Discharge: 2013-02-22 | Disposition: A | Discharge status: Home / Self Care | Payer: 59 | Source: Ambulatory Visit | Attending: Internal Medicine | Admitting: Internal Medicine

## 2013-02-22 VITALS — BP 122/86 | HR 84 | Wt 155.0 lb

## 2013-02-22 DIAGNOSIS — K219 Gastro-esophageal reflux disease without esophagitis: Secondary | ICD-10-CM | POA: Insufficient documentation

## 2013-02-22 DIAGNOSIS — R0609 Other forms of dyspnea: Secondary | ICD-10-CM | POA: Insufficient documentation

## 2013-02-22 DIAGNOSIS — M349 Systemic sclerosis, unspecified: Secondary | ICD-10-CM | POA: Insufficient documentation

## 2013-02-22 DIAGNOSIS — R05 Cough: Secondary | ICD-10-CM | POA: Insufficient documentation

## 2013-02-22 DIAGNOSIS — K3189 Other diseases of stomach and duodenum: Secondary | ICD-10-CM | POA: Insufficient documentation

## 2013-02-22 DIAGNOSIS — R0602 Shortness of breath: Secondary | ICD-10-CM | POA: Insufficient documentation

## 2013-02-22 DIAGNOSIS — R0989 Other specified symptoms and signs involving the circulatory and respiratory systems: Secondary | ICD-10-CM | POA: Insufficient documentation

## 2013-02-22 DIAGNOSIS — R059 Cough, unspecified: Secondary | ICD-10-CM | POA: Insufficient documentation

## 2013-02-22 DIAGNOSIS — R5381 Other malaise: Secondary | ICD-10-CM | POA: Insufficient documentation

## 2013-02-22 HISTORY — DX: Gastro-esophageal reflux disease without esophagitis: K21.9

## 2013-02-22 NOTE — Progress Notes (Signed)
Patient ID: Veronica Houston, female   DOB: [birth year], 37 y.o.   MRN: [id number] Referring Physician: Dr. Azzie Roup Primary Care: Dr. Eliseo Squires [facility] Primary Cardiologist: Previously seen at [facility] Pulmonolgist: Dr. Acquanetta Belling GI: Dr Dulce Sellar  HPI: Veronica Houston is a 37 yo female with a h/u scleroderma (diagnosed 2011) and related GERD. She is referred to our HF clinic by Dr. Dareen Piano for further evaluation of possible pulmonary HTN.   Denies h/o known PAH. Reports she is fatigued a lot and feels SOB from time to time. She feels like the SOB gets worse in the cold.Very active trying to keep up with 71-month old son. Can walk around the grocery store with no issues. Reports SOB with going up incline or stairs. Has severe dyspepsia and seen by GI who just started dexilant. Works at the [employer]. + minimal edema. Denies syncope or pre-syncope. She reports seeing pulmonologist at [facility] and had PFTs and ECHO over a year ago. Does not know results but said they did not mention PAH. Denies syncope, presyncope, CP, palpitations.   Denies any family history of pulmonary HTN or scleroderma.   Review of Systems: [y] = yes, [ ]  = no   General: Weight gain Klaus.Mock ]; Weight loss [ N]; Anorexia [ ] ; Fatigue [ Y]; Fever [ ] ; Chills [ ] ; Weakness [ ]   Cardiac: Chest pain/pressure [ N]; Resting SOB [Y ]; Exertional SOB [ Y]; Orthopnea [ N]; Pedal Edema [Y ]; Palpitations Klaus.Mock ]; Syncope [ ] ; Presyncope [ ] ; Paroxysmal nocturnal dyspnea[ ]   Pulmonary: Cough [Y ]; Wheezing[ ] ; Hemoptysis[ ] ; Sputum [ ] ; Snoring [ ]   GI: Vomiting[ ] ; Dysphagia[ ] ; Melena[ ] ; Hematochezia [ ] ; Heartburn[ ] ; Abdominal pain [ ] ; Constipation [ ] ; Diarrhea [ ] ; BRBPR [ ]   GU: Hematuria[ ] ; Dysuria [ ] ; Nocturia[ ]   Vascular: Pain in legs with walking [ ] ; Pain in feet with lying flat [ ] ; Non-healing sores [ ] ; Stroke [ ] ; TIA [ ] ; Slurred speech [ ] ;  Neuro: Headaches[ ] ; Vertigo[ ] ; Seizures[ ] ; Paresthesias[ ] ;Blurred vision [ ] ;  Diplopia [ ] ; Vision changes [ ]   Ortho/Skin: Arthritis [ ] ; Joint pain [ ] ; Muscle pain [ ] ; Joint swelling [ ] ; Back Pain [ ] ; Rash [ ]   Psych: Depression[ ] ; Anxiety[ ]   Heme: Bleeding problems [ ] ; Clotting disorders [ ] ; Anemia [ ]   Endocrine: Diabetes [ ] ; Thyroid dysfunction[ ]    Past Medical History  Diagnosis Date  . Yeast infection   . H/O bacterial infection   . Cystitis   . UTI (lower urinary tract infection) 01/2011  . Scleroderma Oct 2012    Current Outpatient Prescriptions  Medication Sig Dispense Refill  . Dexlansoprazole 30 MG capsule Take 30 mg by mouth daily.      . mycophenolate (CELLCEPT) 500 MG tablet Take 1,000 mg by mouth 2 (two) times daily.       No current facility-administered medications for this encounter.    Allergies  Allergen Reactions  . Sulfa Antibiotics     Throat burning    History   Social History  . Marital Status: Single    Spouse Name: N/A    Number of Children: N/A  . Years of Education: N/A   Occupational History  . Not on file.   Social History Main Topics  . Smoking status: Never Smoker   . Smokeless tobacco: Never Used  . Alcohol Use: No  . Drug Use: No  .  Sexual Activity: Yes    Birth Control/ Protection: None   Other Topics Concern  . Not on file   Social History Narrative  . No narrative on file    Family History  Problem Relation Age of Onset  . Hypertension Mother   . Hypertension Father   . Hypertension Maternal Grandfather   . Hypertension Paternal Grandmother   . Kidney disease Paternal Grandmother   . Hypertension Paternal Grandfather   . Cancer Paternal Grandfather   . Other Cousin     died from too much anesthesia     Filed Vitals:   02/22/13 1156  BP: 122/86  Pulse: 84  Weight: 155 lb (70.308 kg)  SpO2: 97%   PHYSICAL EXAM: General:  Well appearing. No respiratory difficulty; fatigued appearing HEENT: normal Neck: supple. no JVD. Carotids 2+ bilat; no bruits. No lymphadenopathy or  thryomegaly appreciated. Cor: PMI nondisplaced. Regular rate & rhythm. Soft SEM at apex. No significant TR on exam. P2 not significantly increased Lungs: minimal crackles Abdomen: soft, nontender, nondistended. No hepatosplenomegaly. No bruits or masses. Good bowel sounds. Extremities: no cyanosis, clubbing, rash, trivial edema Neuro: alert & oriented x 3, cranial nerves grossly intact. moves all 4 extremities w/o difficulty. Affect pleasant.   ASSESSMENT & PLAN:  1) Scleroderma - Patient's scleroderma seems to be progressing and is followed by Dr. Azzie Roup. She is going to be referred to [facility] for evaluation in the near future. Concern of pulm HTN so will order PFTs with DLCO and ECHO. If stable will repeat yearly with follow up.  Veronica COSGROVE, NP-C  Patient seen and examined with Veronica Potash, NP. We discussed all aspects of the encounter. I agree with the assessment and plan as stated above.   Clinically she does not seem to have PAH on exam but may be early. She is certainly at risk for it given scleroderma. 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 Will get PFTs and ECHO to further evaluate for PAH. Will also likely need CT chest to gauge degree of lung involvement. I suspect this will be done at [facility]. If evidence of PAH on echo explained that the next step would be RHC.  Await results of testing and will get back to her.   Total time spent 40 minutes with about half that time spent discussing above.   Veronica Veronica Bensimhon,MD 10:46 PM  Addendum (03/22/13) : ECHO EF 55-60%. RV normal. Mild MR/TR. No evidence PH.   PFTs: Moderate airflow obstruction with FEF 25-75% @ 51% predicted. DLCO 42% predicted.  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 Await results of [facility] work-up. Will defer RHC for now pending those results.   Veronica Bensimhon,MD 10:51 PM

## 2013-02-22 NOTE — Patient Instructions (Signed)
Will order pulmonary function tests and ECHO.  Call any issues [phone]  F/U 1 year or as needed

## 2013-03-03 ENCOUNTER — Ambulatory Visit (HOSPITAL_COMMUNITY)
Admission: RE | Admit: 2013-03-03 | Discharge: 2013-03-03 | Disposition: A | Discharge status: Home / Self Care | Payer: 59 | Source: Ambulatory Visit | Attending: Internal Medicine | Admitting: Internal Medicine

## 2013-03-03 DIAGNOSIS — I059 Rheumatic mitral valve disease, unspecified: Secondary | ICD-10-CM

## 2013-03-03 DIAGNOSIS — M349 Systemic sclerosis, unspecified: Secondary | ICD-10-CM

## 2013-03-03 LAB — PULMONARY FUNCTION TEST
DL/VA % pred: 56 %
DLCO cor % pred: 42 %
DLCO unc: 12.04 ml/min/mmHg
FEF 25-75 Post: 1.52 L/sec
FEF2575-%Change-Post: -8 %
FEF2575-%Pred-Post: 47 %
FEF2575-%Pred-Pre: 51 %
FEV1-%Pred-Post: 71 %
FEV1-%Pred-Pre: 72 %
FEV1-Post: 2.07 L
FEV1-Pre: 2.1 L
FEV1FVC-%Change-Post: 7 %
FEV1FVC-%Pred-Pre: 87 %
FEV6-%Pred-Pre: 81 %
FVC-%Change-Post: -8 %
FVC-%Pred-Post: 75 %
FVC-Pre: 2.86 L
Pre FEV1/FVC ratio: 73 %
Pre FEV6/FVC Ratio: 100 %
RV % pred: 149 %
TLC: 4.92 L

## 2013-03-03 MED ORDER — ALBUTEROL SULFATE (5 MG/ML) 0.5% IN NEBU
2.5000 mg | INHALATION_SOLUTION | Freq: Once | RESPIRATORY_TRACT | Status: AC
  Administered 2013-03-03: 2.5 mg via RESPIRATORY_TRACT

## 2013-03-03 NOTE — Progress Notes (Signed)
  Echocardiogram 2D Echocardiogram has been performed.  Cathie Beams 03/03/2013, 11:50 AM

## 2013-03-22 DIAGNOSIS — R0602 Shortness of breath: Secondary | ICD-10-CM | POA: Insufficient documentation

## 2013-03-22 NOTE — Addendum Note (Signed)
Encounter addended by: Dolores Patty, MD on: 03/22/2013 10:51 PM<BR>     Documentation filed: Follow-up Section, LOS Section, Problem List, Notes Section

## 2013-05-02 ENCOUNTER — Telehealth (HOSPITAL_COMMUNITY): Payer: Self-pay | Admitting: Anesthesiology

## 2013-05-02 NOTE — Telephone Encounter (Signed)
Called to review ECHO and PFTs.  EF 55-60%. RV normal. Mild MR/TR. No evidence PH. And PFTs showed moderate airflow obstruction. Dr. 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 She has appointment with [facility] on Thursday and will F/U PRN.   Ulla Potashosgrove, Luqman Perrelli B NP-C 4:40 PM

## 2013-08-17 LAB — PULMONARY FUNCTION TEST

## 2013-09-14 ENCOUNTER — Ambulatory Visit (HOSPITAL_COMMUNITY)
Admission: RE | Admit: 2013-09-14 | Discharge: 2013-09-14 | Disposition: A | Discharge status: Home / Self Care | Payer: 59 | Source: Ambulatory Visit | Attending: Internal Medicine | Admitting: Internal Medicine

## 2013-09-14 VITALS — BP 86/62 | HR 71 | Wt 152.5 lb

## 2013-09-14 DIAGNOSIS — K219 Gastro-esophageal reflux disease without esophagitis: Secondary | ICD-10-CM | POA: Insufficient documentation

## 2013-09-14 DIAGNOSIS — M349 Systemic sclerosis, unspecified: Secondary | ICD-10-CM | POA: Insufficient documentation

## 2013-09-14 DIAGNOSIS — J449 Chronic obstructive pulmonary disease, unspecified: Secondary | ICD-10-CM | POA: Insufficient documentation

## 2013-09-14 DIAGNOSIS — R942 Abnormal results of pulmonary function studies: Secondary | ICD-10-CM | POA: Insufficient documentation

## 2013-09-14 DIAGNOSIS — J4489 Other specified chronic obstructive pulmonary disease: Secondary | ICD-10-CM | POA: Insufficient documentation

## 2013-09-14 NOTE — Progress Notes (Signed)
Patient ID: Veronica Houston, female   DOB: 08/14/1975, 38 y.o.   MRN: [id number] Referring Physician: Dr. Azzie RoupShane Anderson  [facility]  [facility] Cardiologist: [facility]  Pulmonolgist: Dr. Philbert [facility]Bellinger [facility] GI: Dr Dulce Sellarutlaw Rheumatologist [facility]: Dr. Ileana RoupMcMahanan [phone] Office)  HPI:  Veronica Houston is a 38 yo female with a h/u scleroderma (diagnosed 2011) and related GERD. She is referred to our HF clinic by Dr. Dareen PianoAnderson for further evaluation of possible pulmonary HTN.   Denies h/o known PAH. Reports she is fatigued a lot and feels SOB from time to time. She feels like the SOB gets worse in the cold. Very active trying to keep up with 2 yr old son.  ECHO (02/2013)  EF 55-60%. RV normal. Mild MR/TR. No evidence PH. PFTs (02/2013) showed moderate airflow obstruction. Dr. 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  FVC: 2.86 (81%) FEV1: 2.10 (72%) FEF 25-75: 1.66 (51%) DLCO: 12.04 (42% predicted)   Follow up:  Overall doing well. Since [facility] to [facility] rheumatologist and she has not started any new medications and they told her she was stable. They repeated PFTs and they are very simliar to what we have here. She has not had chest CT. They will follow up with her in Oct/Nov. Denies SOB, orthopnea, syncope or CP. Fatigued from having 36 yr old. Able to do everything she wants to do.   SH: [place] with 1226yr old son. No ETOH, tobacco abuse or drug use. 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  FH: Mother Living: healthy, no CAD        Father Living: HTN, no CAD   ROS: All systems negative except as listed in HPI, PMH and Problem List.  Past Medical History  Diagnosis Date  . Yeast infection   . H/O bacterial infection   . Cystitis   . UTI (lower urinary tract infection) 01/2011  . Scleroderma Oct 2012  . GERD (gastroesophageal reflux disease)     Current Outpatient Prescriptions  Medication Sig  Dispense Refill  . amLODipine (NORVASC) 10 MG tablet Take 10 mg by mouth daily.      . mycophenolate (CELLCEPT) 500 MG tablet Take 1,000 mg by mouth 2 (two) times daily.      . ranitidine (ZANTAC) 150 MG tablet Take 150 mg by mouth 2 (two) times daily.       No current facility-administered medications for this encounter.      Filed Vitals:   09/14/13 1148  BP: 86/62  Pulse: 71  Weight: 152 lb 8 oz (69.174 kg)  SpO2: 98%   PHYSICAL EXAM: General:  Well appearing. No resp difficulty HEENT: normal Neck: supple. JVP flat. Carotids 2+ bilaterally; no bruits. No lymphadenopathy or thryomegaly appreciated. Cor: PMI normal. Regular rate & rhythm. No rubs, gallops or murmurs. Lungs: clear Abdomen: soft, nontender, nondistended. No hepatosplenomegaly. No bruits or masses. Good bowel sounds. Extremities: no cyanosis, clubbing, rash, edema Neuro: alert & orientedx3, cranial nerves grossly intact. Moves all 4 extremities w/o difficulty. Affect pleasant.  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 I am concerned about parenchymal lung disease/pulmoanry fibrosis. Will get CT chest with hi-res cuts. Would follow echo closely to watch for development of PH.   Reuel BoomDaniel Bensimhon,MD 4:48  PM

## 2013-09-14 NOTE — Patient Instructions (Addendum)
Will order CT Chest   Follow up in 6 months with ECHO  Call any issues.

## 2013-09-19 ENCOUNTER — Ambulatory Visit (HOSPITAL_COMMUNITY)
Admission: RE | Admit: 2013-09-19 | Discharge: 2013-09-19 | Disposition: A | Discharge status: Home / Self Care | Payer: 59 | Source: Ambulatory Visit | Attending: Anesthesiology | Admitting: Anesthesiology

## 2013-09-19 DIAGNOSIS — M349 Systemic sclerosis, unspecified: Secondary | ICD-10-CM

## 2013-10-09 ENCOUNTER — Encounter (HOSPITAL_COMMUNITY): Payer: Self-pay | Admitting: *Deleted

## 2013-10-09 ENCOUNTER — Encounter (HOSPITAL_COMMUNITY): Payer: Self-pay | Admitting: Internal Medicine

## 2013-10-09 NOTE — Progress Notes (Signed)
Pt was scheduled for High Resolution Chest CT on 09/19/13, pt called and cancelled appointment, letter mailed to pt to c/b to reschedule

## 2013-10-18 ENCOUNTER — Ambulatory Visit (HOSPITAL_COMMUNITY): Payer: 59

## 2013-10-23 ENCOUNTER — Telehealth (HOSPITAL_COMMUNITY): Payer: Self-pay | Admitting: Vascular Surgery

## 2013-10-23 DIAGNOSIS — M349 Systemic sclerosis, unspecified: Secondary | ICD-10-CM

## 2013-10-23 NOTE — Telephone Encounter (Signed)
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

## 2013-10-23 NOTE — Telephone Encounter (Signed)
Pt missed CT scan last week she wants to resch...  Pt needs letter for work.Marland Kitchen. PLEASE ADVISE.Marland Kitchen..Marland Kitchen

## 2013-10-24 NOTE — Telephone Encounter (Signed)
Pt aware CT scan sch for 8/6 to arrive at 8:45, will discuss letter with MD and call her back

## 2013-10-26 ENCOUNTER — Ambulatory Visit (HOSPITAL_COMMUNITY): Payer: 59

## 2013-10-31 ENCOUNTER — Telehealth (HOSPITAL_COMMUNITY): Payer: Self-pay | Admitting: Vascular Surgery

## 2013-10-31 NOTE — Telephone Encounter (Signed)
PLEASE CALL PT SHE LEFT MESSAGE WANTING TO SPEAK TO THE NURSE.Veronica Houston..Veronica Houston

## 2013-10-31 NOTE — Telephone Encounter (Signed)
Left message for pt to return call to schedule CT chest-schleroderma at [facility].  Will attempt to recall later.

## 2013-10-31 NOTE — Telephone Encounter (Signed)
Pt needs high resolution CT of chest for scleroderma, [facility], Aundra MilletMegan can you please call pt and see where she would like done and schedule, thanks

## 2013-11-01 NOTE — Telephone Encounter (Addendum)
See other phone note, pt cx'd CT and [facility], note faxed to MD there, am still awaiting letter from MD

## 2013-11-01 NOTE — Telephone Encounter (Signed)
Ov note faxed to Dr Philbert RiserBellinger at [phone] he will order CT [facility]

## 2013-11-02 NOTE — Telephone Encounter (Signed)
She does not have PAH. Her limitation is due to scleroderma and obstructive lung disease. Please ahver her contact her pulmonologist or rheumatologist for the letter.

## 2014-01-22 ENCOUNTER — Encounter (HOSPITAL_COMMUNITY): Payer: Self-pay

## 2014-04-05 ENCOUNTER — Encounter (HOSPITAL_COMMUNITY): Payer: Self-pay | Admitting: Obstetrics and Gynecology

## 2014-05-07 ENCOUNTER — Ambulatory Visit (HOSPITAL_COMMUNITY): Payer: 59 | Attending: Obstetrics and Gynecology

## 2014-05-07 ENCOUNTER — Inpatient Hospital Stay (HOSPITAL_COMMUNITY): Admission: RE | Admit: 2014-05-07 | Payer: Self-pay | Source: Ambulatory Visit

## 2014-05-07 ENCOUNTER — Telehealth (HOSPITAL_COMMUNITY): Payer: Self-pay | Admitting: Unknown Physician Specialty

## 2014-05-29 ENCOUNTER — Ambulatory Visit (HOSPITAL_COMMUNITY)
Admission: RE | Admit: 2014-05-29 | Discharge: 2014-05-29 | Disposition: A | Discharge status: Home / Self Care | Payer: 59 | Source: Ambulatory Visit | Attending: Obstetrics and Gynecology | Admitting: Obstetrics and Gynecology

## 2014-05-29 ENCOUNTER — Ambulatory Visit (HOSPITAL_COMMUNITY)
Admission: RE | Admit: 2014-05-29 | Discharge: 2014-05-29 | Disposition: A | Discharge status: Home / Self Care | Payer: 59 | Source: Ambulatory Visit | Attending: Cardiology | Admitting: Cardiology

## 2014-05-29 ENCOUNTER — Encounter (HOSPITAL_COMMUNITY): Payer: Self-pay

## 2014-05-29 VITALS — BP 112/66 | HR 71 | Wt 171.2 lb

## 2014-05-29 DIAGNOSIS — J449 Chronic obstructive pulmonary disease, unspecified: Secondary | ICD-10-CM | POA: Diagnosis not present

## 2014-05-29 DIAGNOSIS — M349 Systemic sclerosis, unspecified: Secondary | ICD-10-CM | POA: Diagnosis not present

## 2014-05-29 DIAGNOSIS — K219 Gastro-esophageal reflux disease without esophagitis: Secondary | ICD-10-CM | POA: Diagnosis not present

## 2014-05-29 DIAGNOSIS — Z79899 Other long term (current) drug therapy: Secondary | ICD-10-CM | POA: Diagnosis not present

## 2014-05-29 DIAGNOSIS — Z8249 Family history of ischemic heart disease and other diseases of the circulatory system: Secondary | ICD-10-CM | POA: Insufficient documentation

## 2014-05-29 DIAGNOSIS — I509 Heart failure, unspecified: Secondary | ICD-10-CM

## 2014-05-29 DIAGNOSIS — R0602 Shortness of breath: Secondary | ICD-10-CM

## 2014-05-29 NOTE — Progress Notes (Signed)
Patient ID: [place], female   DOB: 1975/10/31, 39 y.o.   MRN: [id number] Referring Physician: Dr. Azzie RoupShane Anderson  Primary Care: Dr. Eliseo SquiresSax [place]  Primary Cardiologist: [facility]  Pulmonolgist: Dr. Philbert RiserBellinger [facility] [place]: Dr Dulce Sellarutlaw Rheumatologist [place]: Dr. Ileana RoupMcMahanan [phone] Office)  HPI: [place] is a 39 yo female with a h/u scleroderma (diagnosed 2011) and related GERD. She was referred to our HF clinic by Dr. Dareen PianoAnderson for further evaluation of possible pulmonary HTN.   She returns for follow up. Denies SOB/PND/Orthopnea. Does admit to dyspnea if she over does it. Denies presyncope/syncope. [facility] and has follow up in March 30th. Taking all medications.   - ECHO (02/2013)  EF 55-60%. RV normal. Mild MR/TR. No evidence PH. - PFTs (02/2013) showed moderate airflow obstruction. Dr. 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  FVC: 2.86 (81%) FEV1: 2.10 (72%) FEF 25-75: 1.66 (51%) DLCO: 12.04 (42% predicted) - Echo (3/16): EF 60-65%, normal RV size and systolic function, no significant valvular abnormalities, no evidence for pulmonary hypertension.   SH: [place] in [place] with 2 yr old son. No ETOH, tobacco abuse or drug use. 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  FH: Mother Living: healthy, no CAD        Father Living: HTN, no CAD  ROS: All systems negative except as listed in HPI, PMH and Problem List.  Past Medical History  Diagnosis Date  . Yeast infection   . H/O bacterial infection   . Cystitis   . UTI (lower urinary tract infection) 01/2011  . Scleroderma Oct 2012  . GERD (gastroesophageal reflux disease)     Current Outpatient Prescriptions  Medication Sig Dispense Refill  . amLODipine (NORVASC) 10 MG tablet Take 10 mg by mouth daily.    . mycophenolate (CELLCEPT) 500 MG tablet Take 1,000 mg by mouth 2 (two) times daily.    . naproxen (NAPROSYN) 500 MG tablet Take 500 mg by mouth 2 (two) times daily  with a meal.    . ranitidine (ZANTAC) 150 MG tablet Take 150 mg by mouth 2 (two) times daily.     No current facility-administered medications for this encounter.   Filed Vitals:   05/29/14 1403  BP: 112/66  Pulse: 71  Weight: 171 lb 4 oz (77.678 kg)  SpO2: 99%   PHYSICAL EXAM: General:  Well appearing. No resp difficulty HEENT: normal Neck: supple. JVP flat. Carotids 2+ bilaterally; no bruits. No lymphadenopathy or thryomegaly appreciated. Cor: PMI normal. Regular rate & rhythm. No rubs, gallops or murmurs. Lungs: clear Abdomen: soft, nontender, nondistended. No hepatosplenomegaly. No bruits or masses. Good bowel sounds. Extremities: no cyanosis, clubbing, rash, edema Neuro: alert & orientedx3, cranial nerves grossly intact. Moves all 4 extremities w/o difficulty. Affect pleasant.  ASSESSMENT & PLAN:  1) [facility]a- [facility]. Has follow up the end of the month 2) Moderate obstructive lung disease with significantly reduced DLCO  PFTs show reduced DLCO out of proportion to spirometry concerning for PAH. Does not have PAH but limitations due to scleroderma  [name],[name], NP-C  2:10 PM   Patient seen with NP, agree with the above note.  I reviewed today's echo. It appears normal.  The RV is not enlarged with normal function and there is no evidence for PA pressure elevation.   I asked her to have her pulmonary physician send us a copy of her last office visit.    Marca AnconaDalton Lyell Clugston 05/30/2014

## 2014-05-29 NOTE — Patient Instructions (Signed)
We will contact you in 1 year to schedule your next appointment and echocardiogram  

## 2014-08-30 ENCOUNTER — Encounter (HOSPITAL_COMMUNITY): Payer: Self-pay | Admitting: Obstetrics and Gynecology

## 2015-08-27 ENCOUNTER — Encounter (HOSPITAL_COMMUNITY): Payer: Self-pay | Admitting: Internal Medicine

## 2015-08-27 ENCOUNTER — Ambulatory Visit (HOSPITAL_COMMUNITY)
Admission: RE | Admit: 2015-08-27 | Discharge: 2015-08-27 | Disposition: A | Discharge status: Home / Self Care | Payer: 59 | Source: Ambulatory Visit | Attending: Internal Medicine | Admitting: Internal Medicine

## 2015-08-27 DIAGNOSIS — I071 Rheumatic tricuspid insufficiency: Secondary | ICD-10-CM | POA: Diagnosis not present

## 2015-08-27 DIAGNOSIS — M349 Systemic sclerosis, unspecified: Secondary | ICD-10-CM

## 2015-08-27 DIAGNOSIS — I340 Nonrheumatic mitral (valve) insufficiency: Secondary | ICD-10-CM | POA: Insufficient documentation

## 2015-08-27 NOTE — Progress Notes (Signed)
  Echocardiogram 2D Echocardiogram has been performed.  Janalyn HarderWest, Eschol Auxier R 08/27/2015, 1:51 PM

## 2015-09-27 ENCOUNTER — Ambulatory Visit (HOSPITAL_COMMUNITY)
Admission: RE | Admit: 2015-09-27 | Discharge: 2015-09-27 | Disposition: A | Discharge status: Home / Self Care | Payer: 59 | Source: Ambulatory Visit | Attending: Internal Medicine | Admitting: Internal Medicine

## 2015-09-27 ENCOUNTER — Encounter (HOSPITAL_COMMUNITY): Payer: Self-pay | Admitting: Internal Medicine

## 2015-09-27 VITALS — BP 108/68 | HR 76 | Wt 174.0 lb

## 2015-09-27 DIAGNOSIS — Z79899 Other long term (current) drug therapy: Secondary | ICD-10-CM | POA: Diagnosis not present

## 2015-09-27 DIAGNOSIS — M349 Systemic sclerosis, unspecified: Secondary | ICD-10-CM

## 2015-09-27 DIAGNOSIS — K219 Gastro-esophageal reflux disease without esophagitis: Secondary | ICD-10-CM | POA: Diagnosis not present

## 2015-09-27 DIAGNOSIS — J984 Other disorders of lung: Secondary | ICD-10-CM | POA: Diagnosis not present

## 2015-09-27 NOTE — Patient Instructions (Signed)
Non-Cardiac CT scanning, (CAT scanning), is a noninvasive, special x-ray that produces cross-sectional images of the body using x-rays and a computer. CT scans help physicians diagnose and treat medical conditions. For some CT exams, a contrast material is used to enhance visibility in the area of the body being studied. CT scans provide greater clarity and reveal more details than regular x-ray exams.  We will contact you in 1 year to schedule your next appointment along with echocardiogram and pulmonary function test

## 2015-09-27 NOTE — Progress Notes (Signed)
error 

## 2015-09-27 NOTE — Progress Notes (Signed)
PULMONARY HTN CLINIC NOTE  Patient ID: [place], female   DOB: 21-Sep-1975, 40 y.o.   MRN: [id number] Referring Physician: Dr. Azzie RoupShane [place]  Primary Care: Dr. [place] [facility]  [facility] Cardiologist: [facility]  Pulmonolgist: Dr. [place] [facility] GI: Dr [place]  Rheumatologist [facility] [place]: Dr. [place] [phone] Office)  HPI: Ms. [place] is a 40 yo female with a h/u scleroderma (diagnosed 2011) and related GERD. She was referred to our HF clinic by Dr. Dareen PianoAnderson for further evaluation of possible pulmonary HTN.   She returns for follow up. Denies SOB/PND/Orthopnea.  Feels great. No DOE or edema. Denies presyncope/syncope. Now [facility]. Saw them last in May 2017. Doing well f/i ain 6 months. Taking all medications.  + frequent coughing. + GERD. On prilosec and zantac  PFTs (02/2013) showed moderate airflow obstruction. Dr. 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  FVC: 2.86 (81%) FEV1: 2.10 (72%) FEF 25-75: 1.66 (51%) DLCO: 12.04 (42% predicted)  PFTS (7/16)  @ [facility] FEV1  2.41 FVC    2.81L FEV1/FVC 74% DLCO 14.4 (56%)  ECHO (02/2013)  EF 55-60%. RV normal. Mild MR/TR. No evidence PH. Echo (3/16): EF 60-65%, normal RV size and systolic function, no significant valvular abnormalities, no evidence for pulmonary hypertension.  ECHO (08/2015) EF 55-60%. RV ok. Valves ok  SH: [place] 2 yr old son. No ETOH, tobacco abuse or drug use. 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  FH: Mother Living: healthy, no CAD        Father Living: HTN, no CAD  ROS: All systems negative except as listed in HPI, PMH and Problem List.  Past Medical History  Diagnosis Date  . Yeast infection   . H/O bacterial infection   . Cystitis   . UTI (lower urinary tract infection) 01/2011  . Scleroderma [facility](HCC) Oct 2012  . GERD (gastroesophageal reflux disease)     Current Outpatient Prescriptions  Medication Sig  Dispense Refill  . pravastatin (PRAVACHOL) 10 MG tablet Take 10 mg by mouth daily.    Marland Kitchen. amLODipine (NORVASC) 10 MG tablet Take 10 mg by mouth daily.    . mycophenolate (CELLCEPT) 500 MG tablet Take 1,000 mg by mouth 2 (two) times daily.    . naproxen (NAPROSYN) 500 MG tablet Take 500 mg by mouth 2 (two) times daily with a meal.    . ranitidine (ZANTAC) 150 MG tablet Take 150 mg by mouth 2 (two) times daily.     No current facility-administered medications for this encounter.   Filed Vitals:   09/27/15 1004  BP: 108/68  Pulse: 76  Weight: 174 lb (78.926 kg)  SpO2: 99%   PHYSICAL EXAM: General:  Well appearing. No resp difficulty HEENT: normal Neck: supple. JVP flat. Carotids 2+ bilaterally; no bruits. No lymphadenopathy or thryomegaly appreciated. Cor: PMI normal. Regular rate & rhythm. No rubs, gallops or murmurs. Lungs: clear Abdomen: soft, nontender, nondistended. No hepatosplenomegaly. No bruits or masses. Good bowel sounds. Extremities: no cyanosis, clubbing, rash, edema. Mild sclerodactyly Neuro: alert & orientedx3, cranial nerves grossly intact. Moves all 4 extremities w/o difficulty. Affect pleasant.  ASSESSMENT & PLAN:  1) [facility]oderma- [facility] [place]. Has follow up the end of the month 2) Mild obstructive lung disease with moderately reduced DLCO - improved from previous  No evidence of PAH by echo. DLCO remains depressed but has improved with treatment of scleroderma. Will get hi-res CT to quantify degree of ILD.  Continue with yearly echos and PFTs with DLCO  Arvilla MeresBensimhon, Daniel, MD 10:54 AM

## 2015-10-01 ENCOUNTER — Telehealth (HOSPITAL_COMMUNITY): Payer: Self-pay | Admitting: *Deleted

## 2015-10-01 NOTE — Telephone Encounter (Signed)
Pre cert submitted on 09/30/2015/  Peer to Peer review required. Dr.Bensimhon has to call [phone] select opt 3 to engage in physician to physician review.  *Case Number [id number]*

## 2015-10-09 ENCOUNTER — Ambulatory Visit (HOSPITAL_COMMUNITY): Payer: 59

## 2015-10-14 NOTE — Telephone Encounter (Signed)
Pre cert approved for high res chest CT.  Notification #[id number]

## 2015-10-17 ENCOUNTER — Ambulatory Visit (HOSPITAL_COMMUNITY)
Admission: RE | Admit: 2015-10-17 | Discharge: 2015-10-17 | Disposition: A | Discharge status: Home / Self Care | Payer: 59 | Source: Ambulatory Visit | Attending: Internal Medicine | Admitting: Internal Medicine

## 2015-10-17 DIAGNOSIS — M349 Systemic sclerosis, unspecified: Secondary | ICD-10-CM

## 2015-10-17 DIAGNOSIS — J849 Interstitial pulmonary disease, unspecified: Secondary | ICD-10-CM | POA: Diagnosis not present

## 2015-10-17 DIAGNOSIS — R590 Localized enlarged lymph nodes: Secondary | ICD-10-CM | POA: Diagnosis not present

## 2016-05-20 ENCOUNTER — Telehealth (HOSPITAL_COMMUNITY): Payer: Self-pay | Admitting: *Deleted

## 2016-05-20 DIAGNOSIS — R0600 Dyspnea, unspecified: Secondary | ICD-10-CM

## 2016-05-20 DIAGNOSIS — M349 Systemic sclerosis, unspecified: Secondary | ICD-10-CM

## 2016-05-20 NOTE — Telephone Encounter (Signed)
Pt due for yearly f/u, echo and pfts per Dr Gala RomneyBensimhon, orders palced

## 2016-06-01 ENCOUNTER — Ambulatory Visit (HOSPITAL_COMMUNITY): Admission: RE | Admit: 2016-06-01 | Payer: 59 | Source: Ambulatory Visit

## 2016-06-15 ENCOUNTER — Ambulatory Visit (HOSPITAL_COMMUNITY)
Admission: RE | Admit: 2016-06-15 | Discharge: 2016-06-15 | Disposition: A | Discharge status: Home / Self Care | Payer: 59 | Source: Ambulatory Visit | Attending: Internal Medicine | Admitting: Internal Medicine

## 2016-06-15 DIAGNOSIS — M349 Systemic sclerosis, unspecified: Secondary | ICD-10-CM | POA: Diagnosis not present

## 2016-06-15 DIAGNOSIS — R0600 Dyspnea, unspecified: Secondary | ICD-10-CM | POA: Insufficient documentation

## 2016-06-15 NOTE — Progress Notes (Signed)
Echocardiogram 2D Echocardiogram has been performed.  Marisue Humblelexis N Tarini Carrier 06/15/2016, 3:25 PM

## 2016-07-06 ENCOUNTER — Encounter (HOSPITAL_COMMUNITY): Payer: Self-pay | Admitting: *Deleted

## 2016-07-06 ENCOUNTER — Other Ambulatory Visit (HOSPITAL_COMMUNITY): Payer: Self-pay | Admitting: *Deleted

## 2016-07-06 ENCOUNTER — Encounter (HOSPITAL_COMMUNITY): Payer: Self-pay | Admitting: Internal Medicine

## 2016-07-06 ENCOUNTER — Ambulatory Visit (HOSPITAL_COMMUNITY)
Admission: RE | Admit: 2016-07-06 | Discharge: 2016-07-06 | Disposition: A | Discharge status: Home / Self Care | Payer: 59 | Source: Ambulatory Visit | Attending: Internal Medicine | Admitting: Internal Medicine

## 2016-07-06 ENCOUNTER — Other Ambulatory Visit (HOSPITAL_COMMUNITY): Payer: 59

## 2016-07-06 VITALS — BP 118/76 | HR 77 | Wt 178.8 lb

## 2016-07-06 DIAGNOSIS — M349 Systemic sclerosis, unspecified: Secondary | ICD-10-CM | POA: Insufficient documentation

## 2016-07-06 DIAGNOSIS — Z8619 Personal history of other infectious and parasitic diseases: Secondary | ICD-10-CM | POA: Insufficient documentation

## 2016-07-06 DIAGNOSIS — Z8249 Family history of ischemic heart disease and other diseases of the circulatory system: Secondary | ICD-10-CM | POA: Insufficient documentation

## 2016-07-06 DIAGNOSIS — Z87440 Personal history of urinary (tract) infections: Secondary | ICD-10-CM | POA: Insufficient documentation

## 2016-07-06 DIAGNOSIS — R0602 Shortness of breath: Secondary | ICD-10-CM | POA: Diagnosis not present

## 2016-07-06 DIAGNOSIS — R942 Abnormal results of pulmonary function studies: Secondary | ICD-10-CM | POA: Diagnosis not present

## 2016-07-06 DIAGNOSIS — K219 Gastro-esophageal reflux disease without esophagitis: Secondary | ICD-10-CM | POA: Insufficient documentation

## 2016-07-06 DIAGNOSIS — R0600 Dyspnea, unspecified: Secondary | ICD-10-CM

## 2016-07-06 DIAGNOSIS — J449 Chronic obstructive pulmonary disease, unspecified: Secondary | ICD-10-CM | POA: Insufficient documentation

## 2016-07-06 DIAGNOSIS — J849 Interstitial pulmonary disease, unspecified: Secondary | ICD-10-CM | POA: Diagnosis not present

## 2016-07-06 DIAGNOSIS — J8410 Pulmonary fibrosis, unspecified: Secondary | ICD-10-CM | POA: Diagnosis not present

## 2016-07-06 LAB — BASIC METABOLIC PANEL
Anion gap: 10 (ref 5–15)
BUN: 10 mg/dL (ref 6–20)
CALCIUM: 9.2 mg/dL (ref 8.9–10.3)
CHLORIDE: 103 mmol/L (ref 101–111)
CO2: 24 mmol/L (ref 22–32)
Creatinine, Ser: 0.75 mg/dL (ref 0.44–1.00)
GFR calc non Af Amer: >60 mL/min (ref >60)
Glucose, Bld: 87 mg/dL (ref 65–99)
Potassium: 4.5 mmol/L (ref 3.5–5.1)
SODIUM: 137 mmol/L (ref 135–145)

## 2016-07-06 LAB — CBC
HCT: 41.9 % (ref 36.0–46.0)
Hemoglobin: 13.4 g/dL (ref 12.0–15.0)
MCH: 29.3 pg (ref 26.0–34.0)
MCHC: 32 g/dL (ref 30.0–36.0)
MCV: 91.5 fL (ref 78.0–100.0)
Platelets: 242 10*3/uL (ref 150–400)
RBC: 4.58 MIL/uL (ref 3.87–5.11)
RDW: 12.9 % (ref 11.5–15.5)
WBC: 5.3 10*3/uL (ref 4.0–10.5)

## 2016-07-06 LAB — PROTIME-INR
INR: 0.97
PROTHROMBIN TIME: 12.9 s (ref 11.4–15.2)

## 2016-07-06 NOTE — Patient Instructions (Signed)
Right Heart Catheterization on Friday 07/10/16, see instruction sheet  We will contact you in 3-4 months to schedule your next appointment.

## 2016-07-06 NOTE — Progress Notes (Signed)
Pulmonary Hypertension Clinic Note   Patient ID: [place], female   DOB: 03/09/1976, 41 y.o.   MRN: [id number] Referring Physician: Dr. [place]  Primary Care: Dr. Sax [facility]  Primary Cardiologist: Previously seen at [facility]  Pulmonolgist: Dr. Bellinger [facility] GI: Dr Outlaw  Rheumatologist [facility]: Dr. McMahanan ([phone] Office)  HPI: [place] is a 41 y.o. female with a h/u scleroderma (diagnosed 2011) and related GERD. She was referred to our HF clinic by Dr. Anderson for further evaluation of possible pulmonary HTN.   She presents today for yearly follow up. On a good or bad day can walk as far as she wants on flat ground. On a bad day gets SOB with incline, in a hurry, changing clothes or bathing, and occasionally at rest, which is usually around stressful situations. Denies edema.  Occasional orthopnea and bendopnea. No presyncope/syncope. Taking all medications as directed. Still following with Scleroderma clinic at [facility]. Was seen by them in February and switched to Nexium bid and amlodipine increased 2.5 -> 7.5 for finger ulceration. 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  + Patulous esophagus and mediastinal lymphadenopathy. Recommend 6-12 month follow up CT.   PFTs (02/2013) showed moderate airflow obstruction. Dr. Jakaya Jacobowitz suspects pulmonary scleroderma is major issue.  FVC: 2.86 (81%) FEV1: 2.10 (72%) FEF 25-75: 1.66 (51%) DLCO: 12.04 (42% predicted)  PFTS (7/16)  @ [facility] FEV1  2.41 FVC    2.81L FEV1/FVC 74% DLCO 14.4 (56%)  PFTs 05/20/16 with mild restrictive ventilatory defect and severe gas transfer defect. FVC 2.35 (76%) FEV1 1.96 (76%) FEV1/FVC 83% TLC 3.36 (71%) DLCO 7.68 (37%)  ECHO (02/2013)  EF 55-60%. RV normal. Mild MR/TR. No evidence  PH.  Echo (3/16): EF 60-65%, normal RV size and systolic function, no significant valvular abnormalities, no evidence for pulmonary hypertension.   ECHO (08/2015) EF 55-60%. RV ok. Valves ok  Echo 06/15/16 LVEF 60-65%, No complete TR doppler jet so unable to estimate PA systolic pressure.   SH: Lives in [place] with now 4 yr old son. No ETOH, tobacco abuse or drug use. 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  FH: Mother Living: healthy, no CAD        Father Living: HTN, no CAD  Review of systems complete and found to be negative unless listed in HPI.     Past Medical History:  Diagnosis Date  . Cystitis   . GERD (gastroesophageal reflux disease)   . H/O bacterial infection   . Scleroderma (HCC) Oct 2012  . UTI (lower urinary tract infection) 01/2011  . Yeast infection     Current Outpatient Prescriptions  Medication Sig Dispense Refill  . amLODipine (NORVASC) 2.5 MG tablet Take 7.5 mg by mouth daily.    . esomeprazole (NEXIUM) 40 MG capsule Take 40 mg by mouth daily.    . mycophenolate (CELLCEPT) 500 MG tablet Take 1,000 mg by mouth 2 (two) times daily.    . naproxen (NAPROSYN) 500 MG tablet Take 500 mg by mouth 2 (two) times daily with a meal.    . ranitidine (ZANTAC) 150 MG tablet Take 150 mg by mouth 2 (two) times daily.     No current facility-administered medications for this encounter.    Vitals:   07/06/16 0928  BP: 118/76    Pulse: 77  SpO2: 98%  Weight: 178 lb 12 oz (81.1 kg)   Wt Readings from Last 3 Encounters:  07/06/16 178 lb 12 oz (81.1 kg)  09/27/15 174 lb (78.9 kg)  05/29/14 171 lb 4 oz (77.7 kg)    PHYSICAL EXAM: General:  Well appearing AA female in NAD.  HEENT: Normal Neck: supple. JVP 6-7 cm. Carotids 2+ bilaterally; no bruits. No thyromegaly or nodule noted.  Cor: PMI non-displaced. RRR. No M/G/R noted.  Lungs: Clear, though slightly diminished.  Abdomen: Soft, NT, ND, no HSM. No bruits or masses. +BS.  Extremities: No cyanosis, clubbing, or  rash. No edema. + mild sclerodactyly.  Neuro: Alert & oriented x 3. Cranial nerves grossly intact. Moves all 4 extremities w/o difficulty. Affect pleasant    ASSESSMENT & PLAN:  1) Scleroderma - Follows at [facility]. Seen in February for PFTs.  2) Mild obstructive lung disease with moderately reduced DLCO - DLCO continues to drop  - No evidence of PAH or RV strain by echo, but no complete TR jet appreciated.    [name], PA-C  9:30 AM   Patient seen and examined with the above-signed Advanced Practice Provider and/or Housestaff. I personally reviewed laboratory data, imaging studies and relevant notes. I independently examined the patient and formulated the important aspects of the plan. I have edited the note to reflect any of my changes or salient points. I have personally discussed the plan with the patient and/or family.  ECHO, recent PFTs and chest CT all reviewed personally and discussed with her.   She has worsening NYHA II-III exertional dyspnea. DLCO continues to drop on PFTS. No evidence of PAH on echo. CT with evidence of pulmonary fibrosis. Suspect drop in DLCO related to ILD and not PAH but needs RHC to confirm. Will schedule RHC for next week. Plan today. Will have her arrange pulmoanry f/u with Dr. Philbert Riser ASAP.  Arvilla Meres, MD  10:32 PM

## 2016-07-06 NOTE — Progress Notes (Signed)
6 min walk completed.  Patient ambulated 1340 ft (408.4 m) with no complaints

## 2016-07-10 ENCOUNTER — Ambulatory Visit (HOSPITAL_COMMUNITY)
Admission: RE | Admit: 2016-07-10 | Discharge: 2016-07-10 | Disposition: A | Discharge status: Home / Self Care | Payer: 59 | Source: Ambulatory Visit | Attending: Internal Medicine | Admitting: Internal Medicine

## 2016-07-10 ENCOUNTER — Encounter: Payer: Self-pay | Admitting: Internal Medicine

## 2016-07-10 ENCOUNTER — Encounter (HOSPITAL_COMMUNITY)
Admission: RE | Disposition: A | Discharge status: Home / Self Care | Payer: Self-pay | Source: Ambulatory Visit | Attending: Internal Medicine

## 2016-07-10 ENCOUNTER — Encounter (HOSPITAL_COMMUNITY): Payer: Self-pay | Admitting: *Deleted

## 2016-07-10 DIAGNOSIS — J449 Chronic obstructive pulmonary disease, unspecified: Secondary | ICD-10-CM | POA: Diagnosis not present

## 2016-07-10 DIAGNOSIS — M349 Systemic sclerosis, unspecified: Secondary | ICD-10-CM | POA: Insufficient documentation

## 2016-07-10 DIAGNOSIS — K219 Gastro-esophageal reflux disease without esophagitis: Secondary | ICD-10-CM | POA: Insufficient documentation

## 2016-07-10 DIAGNOSIS — R0600 Dyspnea, unspecified: Secondary | ICD-10-CM

## 2016-07-10 HISTORY — PX: RIGHT HEART CATH: CATH118263

## 2016-07-10 LAB — POCT I-STAT 3, VENOUS BLOOD GAS (G3P V)
ACID-BASE DEFICIT: 5 mmol/L — AB (ref 0.0–2.0)
Acid-base deficit: 3 mmol/L — ABNORMAL HIGH (ref 0.0–2.0)
Acid-base deficit: 3 mmol/L — ABNORMAL HIGH (ref 0.0–2.0)
BICARBONATE: 22.1 mmol/L (ref 20.0–28.0)
Bicarbonate: 20.5 mmol/L (ref 20.0–28.0)
Bicarbonate: 22.1 mmol/L (ref 20.0–28.0)
O2 SAT: 74 %
O2 SAT: 75 %
O2 Saturation: 76 %
PCO2 VEN: 37.9 mmHg — AB (ref 44.0–60.0)
PCO2 VEN: 39.2 mmHg — AB (ref 44.0–60.0)
PCO2 VEN: 40.2 mmHg — AB (ref 44.0–60.0)
PH VEN: 7.348 (ref 7.250–7.430)
PH VEN: 7.36 (ref 7.250–7.430)
PO2 VEN: 41 mmHg (ref 32.0–45.0)
TCO2: 22 mmol/L (ref 0–100)
TCO2: 23 mmol/L (ref 0–100)
TCO2: 23 mmol/L (ref 0–100)
pH, Ven: 7.342 (ref 7.250–7.430)
pO2, Ven: 42 mmHg (ref 32.0–45.0)
pO2, Ven: 43 mmHg (ref 32.0–45.0)

## 2016-07-10 SURGERY — RIGHT HEART CATH
Anesthesia: LOCAL

## 2016-07-10 MED ORDER — MIDAZOLAM HCL 2 MG/2ML IJ SOLN
INTRAMUSCULAR | Status: AC
  Filled 2016-07-10: qty 2

## 2016-07-10 MED ORDER — SODIUM CHLORIDE 0.9 % IV SOLN: 250.0000 mL | INTRAVENOUS | Status: DC | PRN

## 2016-07-10 MED ORDER — SODIUM CHLORIDE 0.9% FLUSH: 3.0000 mL | INTRAVENOUS | Status: DC | PRN

## 2016-07-10 MED ORDER — ONDANSETRON HCL 4 MG/2ML IJ SOLN: 4.0000 mg | Freq: Four times a day (QID) | INTRAMUSCULAR | Status: DC | PRN

## 2016-07-10 MED ORDER — HEPARIN (PORCINE) IN NACL 2-0.9 UNIT/ML-% IJ SOLN
INTRAMUSCULAR | Status: AC
  Filled 2016-07-10: qty 1000

## 2016-07-10 MED ORDER — ASPIRIN 81 MG PO CHEW
81.0000 mg | CHEWABLE_TABLET | ORAL | Status: AC
  Administered 2016-07-10: 81 mg via ORAL

## 2016-07-10 MED ORDER — SODIUM CHLORIDE 0.9% FLUSH: 3.0000 mL | Freq: Two times a day (BID) | INTRAVENOUS | Status: DC

## 2016-07-10 MED ORDER — FENTANYL CITRATE (PF) 100 MCG/2ML IJ SOLN
INTRAMUSCULAR | Status: DC | PRN
  Administered 2016-07-10: 25 ug via INTRAVENOUS

## 2016-07-10 MED ORDER — ACETAMINOPHEN 325 MG PO TABS: 650.0000 mg | ORAL_TABLET | ORAL | Status: DC | PRN

## 2016-07-10 MED ORDER — ASPIRIN 81 MG PO CHEW
CHEWABLE_TABLET | ORAL | Status: AC
  Administered 2016-07-10: 81 mg via ORAL
  Filled 2016-07-10: qty 1

## 2016-07-10 MED ORDER — FENTANYL CITRATE (PF) 100 MCG/2ML IJ SOLN
INTRAMUSCULAR | Status: AC
  Filled 2016-07-10: qty 2

## 2016-07-10 MED ORDER — HEPARIN (PORCINE) IN NACL 2-0.9 UNIT/ML-% IJ SOLN
INTRAMUSCULAR | Status: DC | PRN
  Administered 2016-07-10: 1000 mL

## 2016-07-10 MED ORDER — LIDOCAINE HCL (PF) 1 % IJ SOLN
INTRAMUSCULAR | Status: DC | PRN
  Administered 2016-07-10: 2 mL

## 2016-07-10 MED ORDER — MIDAZOLAM HCL 2 MG/2ML IJ SOLN
INTRAMUSCULAR | Status: DC | PRN
  Administered 2016-07-10: 1 mg via INTRAVENOUS

## 2016-07-10 MED ORDER — LIDOCAINE HCL 1 % IJ SOLN
INTRAMUSCULAR | Status: AC
  Filled 2016-07-10: qty 20

## 2016-07-10 MED ORDER — SODIUM CHLORIDE 0.9 % IV SOLN
INTRAVENOUS | Status: DC
  Administered 2016-07-10: 11:00:00 via INTRAVENOUS

## 2016-07-10 SURGICAL SUPPLY — 9 items
CATH BALLN WEDGE 5F 110CM (CATHETERS) ×2 IMPLANT
KIT HEART LEFT (KITS) ×2 IMPLANT
PACK CARDIAC CATHETERIZATION (CUSTOM PROCEDURE TRAY) ×2 IMPLANT
PROTECTION STATION PRESSURIZED (MISCELLANEOUS) ×2
SHEATH GLIDE SLENDER 4/5FR (SHEATH) ×2 IMPLANT
STATION PROTECTION PRESSURIZED (MISCELLANEOUS) ×1 IMPLANT
TRANSDUCER W/STOPCOCK (MISCELLANEOUS) ×2 IMPLANT
TUBING ART PRESS 72  MALE/FEM (TUBING) ×1
TUBING ART PRESS 72 MALE/FEM (TUBING) ×1 IMPLANT

## 2016-07-10 NOTE — Discharge Instructions (Signed)
Venogram, Care After °This sheet gives you information about how to care for yourself after your procedure. Your health care provider may also give you more specific instructions. If you have problems or questions, contact your health care provider. °What can I expect after the procedure? °After the procedure, it is common to have: °· Bruising or mild discomfort in the area where the IV was inserted (insertion site). ° °Follow these instructions at home: °Eating and drinking °· Follow instructions from your health care provider about eating or drinking restrictions. °· Drink a lot of fluids for the first several days after the procedure, as directed by your health care provider. This helps to wash (flush) the contrast out of your body. Examples of healthy fluids include water or low-calorie drinks. °General instructions °· Check your IV insertion area every day for signs of infection. Check for: °? Redness, swelling, or pain. °? Fluid or blood. °? Warmth. °? Pus or a bad smell. °· Take over-the-counter and prescription medicines only as told by your health care provider. °· Rest and return to your normal activities as told by your health care provider. Ask your health care provider what activities are safe for you. °· Do not drive for 24 hours if you were given a medicine to help you relax (sedative), or until your health care provider approves. °· Keep all follow-up visits as told by your health care provider. This is important. °Contact a health care provider if: °· Your skin becomes itchy or you develop a rash or hives. °· You have a fever that does not get better with medicine. °· You feel nauseous. °· You vomit. °· You have redness, swelling, or pain around the insertion site. °· You have fluid or blood coming from the insertion site. °· Your insertion area feels warm to the touch. °· You have pus or a bad smell coming from the insertion site. °Get help right away if: °· You have difficulty breathing or  shortness of breath. °· You develop chest pain. °· You faint. °· You feel very dizzy. °These symptoms may represent a serious problem that is an emergency. Do not wait to see if the symptoms will go away. Get medical help right away. Call your local emergency services (911 in the [place] Do not drive yourself to the hospital. °Summary °· After your procedure, it is common to have bruising or mild discomfort in the area where the IV was inserted. °· You should check your IV insertion area every day for signs of infection. °· Take over-the-counter and prescription medicines only as told by your health care provider. °· You should drink a lot of fluids for the first several days after the procedure to help flush the contrast from your body. °This information is not intended to replace advice given to you by your health care provider. Make sure you discuss any questions you have with your health care provider. °Document Released: 12/28/2012 Document Revised: 02/01/2016 Document Reviewed: 02/01/2016 °Elsevier Interactive Patient Education © 2017 [employer] ° °

## 2016-07-10 NOTE — H&P (View-Only) (Signed)
Pulmonary Hypertension Clinic Note   Patient ID: Veronica Houston, female   DOB: Feb 08, 1976, 41 y.o.   MRN: [id number] Referring Physician: Dr. Azzie Roup  Primary Care: Dr. Eliseo Squires [facility]  Primary Cardiologist: Previously seen at [facility]  Pulmonolgist: Dr. Philbert Riser [facility] GI: Dr Dulce Sellar  Rheumatologist [facility]: Dr. Ileana Roup [phone] Office)  HPI: Veronica Houston is a 41 y.o. female with a h/u scleroderma (diagnosed 2011) and related GERD. She was referred to our HF clinic by Dr. Dareen Piano for further evaluation of possible pulmonary HTN.   She presents today for yearly follow up. On a good or bad day can walk as far as she wants on flat ground. On a bad day gets SOB with incline, in a hurry, changing clothes or bathing, and occasionally at rest, which is usually around stressful situations. Denies edema.  Occasional orthopnea and bendopnea. No presyncope/syncope. Taking all medications as directed. Still following with Scleroderma clinic at [facility]. Was seen by them in February and switched to Nexium bid and amlodipine increased 2.5 -> 7.5 for finger ulceration. 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  + Patulous esophagus and mediastinal lymphadenopathy. Recommend 6-12 month follow up CT.   PFTs (02/2013) showed moderate airflow obstruction. Dr. 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  FVC: 2.86 (81%) FEV1: 2.10 (72%) FEF 25-75: 1.66 (51%) DLCO: 12.04 (42% predicted)  PFTS (7/16)  @ [facility] FEV1  2.41 FVC    2.81L FEV1/FVC 74% DLCO 14.4 (56%)  PFTs 05/20/16 with mild restrictive ventilatory defect and severe gas transfer defect. FVC 2.35 (76%) FEV1 1.96 (76%) FEV1/FVC 83% TLC 3.36 (71%) DLCO 7.68 (37%)  ECHO (02/2013)  EF 55-60%. RV normal. Mild MR/TR. No evidence  PH.  Echo (3/16): EF 60-65%, normal RV size and systolic function, no significant valvular abnormalities, no evidence for pulmonary hypertension.   ECHO (08/2015) EF 55-60%. RV ok. Valves ok  Echo 06/15/16 LVEF 60-65%, No complete TR doppler jet so unable to estimate PA systolic pressure.   SH: Lives in [place] with now 34 yr old son. No ETOH, tobacco abuse or drug use. Works as Naval architect at [employer].  FH: Mother Living: healthy, no CAD        Father Living: HTN, no CAD  Review of systems complete and found to be negative unless listed in HPI.     Past Medical History:  Diagnosis Date  . Cystitis   . GERD (gastroesophageal reflux disease)   . H/O bacterial infection   . Scleroderma [facility]) Oct 2012  . UTI (lower urinary tract infection) 01/2011  . Yeast infection     Current Outpatient Prescriptions  Medication Sig Dispense Refill  . amLODipine (NORVASC) 2.5 MG tablet Take 7.5 mg by mouth daily.    Marland Kitchen esomeprazole (NEXIUM) 40 MG capsule Take 40 mg by mouth daily.    . mycophenolate (CELLCEPT) 500 MG tablet Take 1,000 mg by mouth 2 (two) times daily.    . naproxen (NAPROSYN) 500 MG tablet Take 500 mg by mouth 2 (two) times daily with a meal.    . ranitidine (ZANTAC) 150 MG tablet Take 150 mg by mouth 2 (two) times daily.     No current facility-administered medications for this encounter.    Vitals:   07/06/16 0928  BP: 118/76  Pulse: 77  SpO2: 98%  Weight: 178 lb 12 oz (81.1 kg)   Wt Readings from Last 3 Encounters:  07/06/16 178 lb 12 oz (81.1 kg)  09/27/15 174 lb (78.9 kg)  05/29/14 171 lb 4 oz (77.7 kg)    PHYSICAL EXAM: General:  Well appearing AA female in NAD.  HEENT: Normal Neck: supple. JVP 6-7 cm. Carotids 2+ bilaterally; no bruits. No thyromegaly or nodule noted.  Cor: PMI non-displaced. RRR. No M/G/R noted.  Lungs: Clear, though slightly diminished.  Abdomen: Soft, NT, ND, no HSM. No bruits or masses. +BS.  Extremities: No cyanosis, clubbing, or  rash. No edema. + mild sclerodactyly.  Neuro: Alert & oriented x 3. Cranial nerves grossly intact. Moves all 4 extremities w/o difficulty. Affect pleasant    ASSESSMENT & PLAN:  1) Scleroderma - Follows at [facility]. Seen in February for PFTs.  2) Mild obstructive lung disease with moderately reduced DLCO - DLCO continues to drop  - No evidence of PAH or RV strain by echo, but no complete TR jet appreciated.    [name], PA-C  9:30 AM   Patient seen and examined with the above-signed Advanced Practice Provider and/or Housestaff. I personally reviewed laboratory data, imaging studies and relevant notes. I independently examined the patient and formulated the important aspects of the plan. I have edited the note to reflect any of my changes or salient points. I have personally discussed the plan with the patient and/or family.  ECHO, recent PFTs and chest CT all reviewed personally and discussed with her.   She has worsening NYHA II-III exertional dyspnea. DLCO continues to drop on PFTS. No evidence of PAH on echo. CT with evidence of pulmonary fibrosis. Suspect drop in DLCO related to ILD and not PAH but needs RHC to confirm. Will schedule RHC for next week. Plan today. Will have her arrange pulmoanry f/u with Dr. Philbert Riser ASAP.  Arvilla Meres, MD  10:32 PM

## 2016-07-10 NOTE — Addendum Note (Signed)
Encounter addended by: Dolores Patty, MD on: 07/10/2016 12:12 PM<BR>    Actions taken: Letter status changed

## 2016-07-10 NOTE — Interval H&P Note (Signed)
History and Physical Interval Note:  07/10/2016 11:35 AM  Veronica Houston  has presented today for surgery, with the diagnosis of pah  The various methods of treatment have been discussed with the patient and family. After consideration of risks, benefits and other options for treatment, the patient has consented to  Procedure(s): Right Heart Cath (N/A) as a surgical intervention .  The patient's history has been reviewed, patient examined, no change in status, stable for surgery.  I have reviewed the patient's chart and labs.  Questions were answered to the patient's satisfaction.     Aftin Lye, Reuel Boom

## 2016-08-16 IMAGING — CT CT CHEST HIGH RESOLUTION W/O CM
2 of 7 series · 13 of 36 positions shown, 16 images · non-contrast
Comparison: None.

CLINICAL DATA: 40-year-old female with history of scleroderma.
Evaluate for potential interstitial lung disease.

EXAM:
CT CHEST WITHOUT CONTRAST
TECHNIQUE: Multidetector CT imaging of the chest was performed following the
standard protocol without intravenous contrast. High resolution
imaging of the lungs, as well as inspiratory and expiratory imaging,
was performed.

[Series 4: chest w/o 5mm st · axial · non-contrast · 0.59mm/px · z∈[+212,+426]mm · 10 of 131 slices shown, 13 images]
[im 12/131  mediastinal]
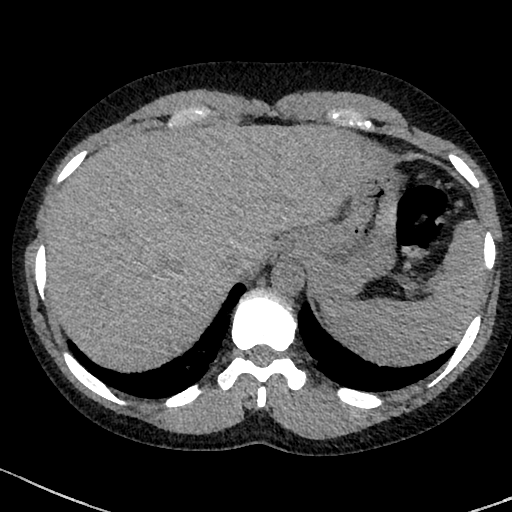
[im 12/131  lung]
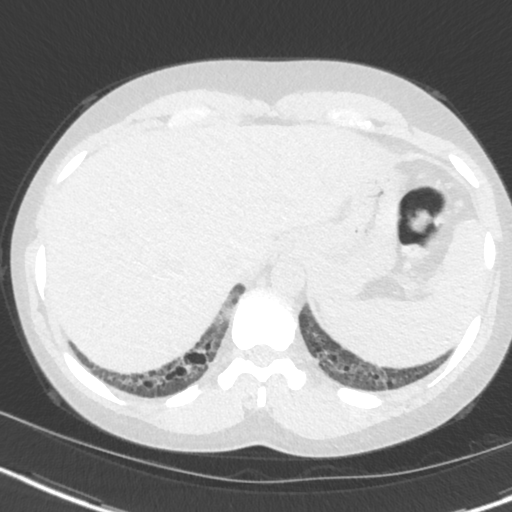
[im 24/131  lung]
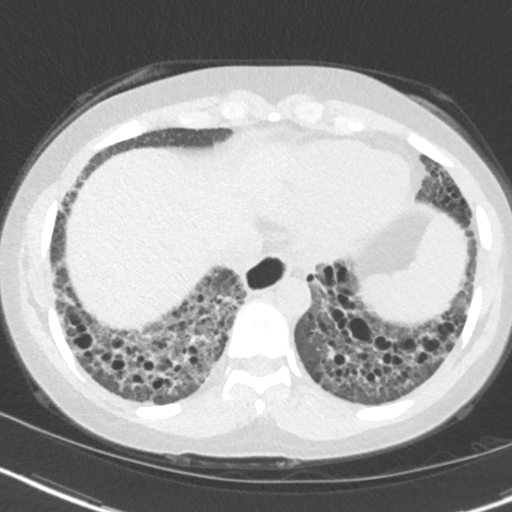
[im 36/131  lung]
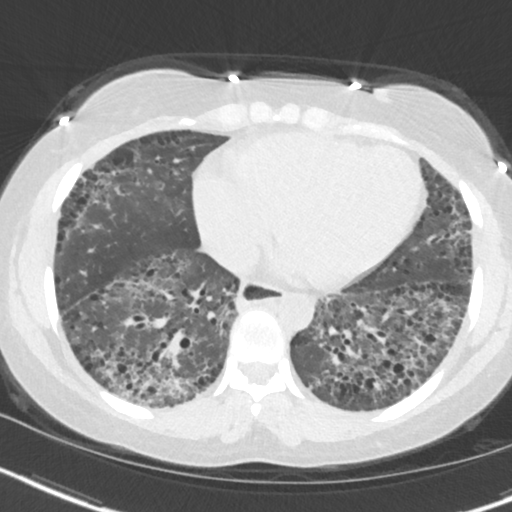
[im 48/131  lung]
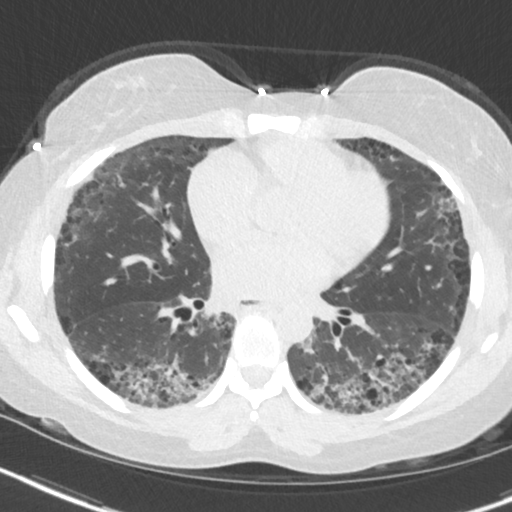
[im 60/131  mediastinal]
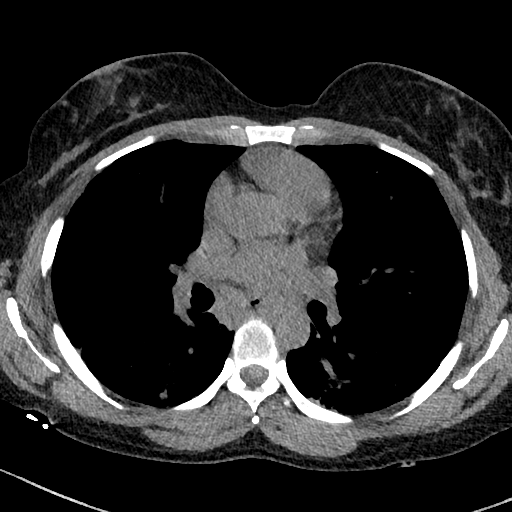
[im 60/131  lung]
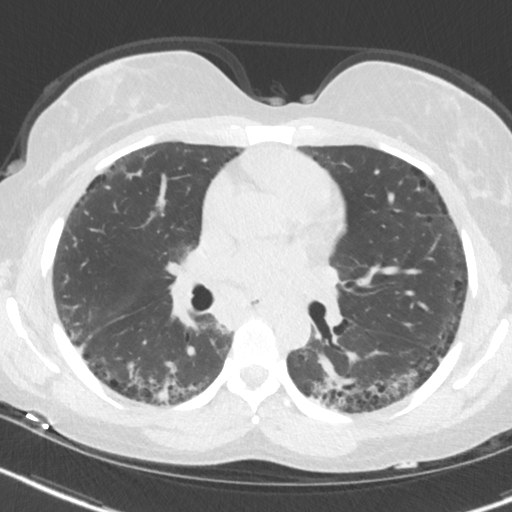
[im 71/131  lung]
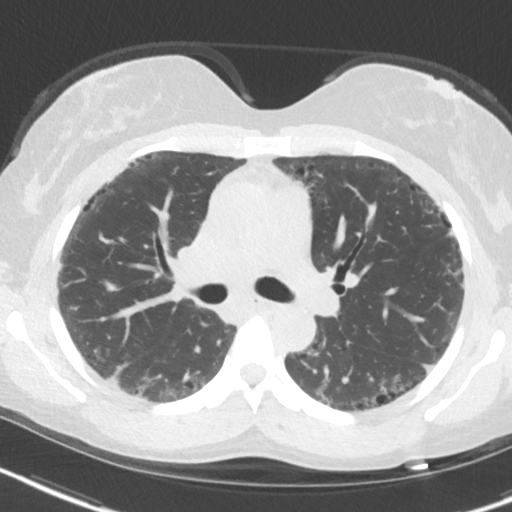
[im 83/131  lung]
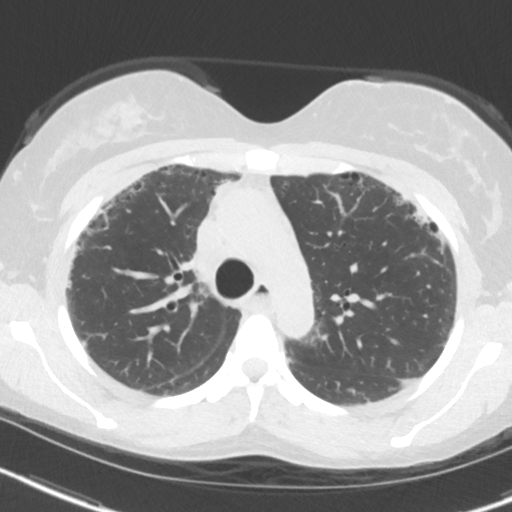
[im 95/131  lung]
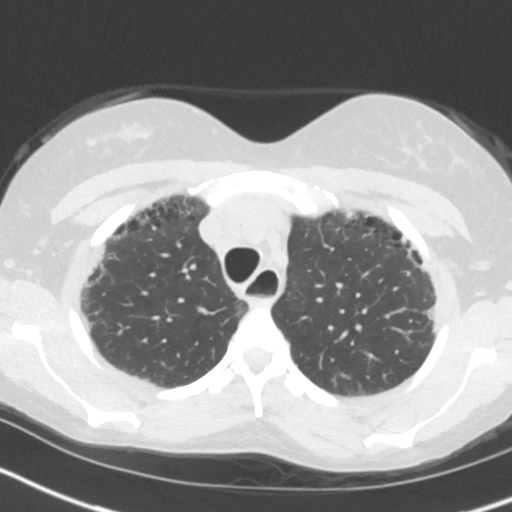
[im 107/131  mediastinal]
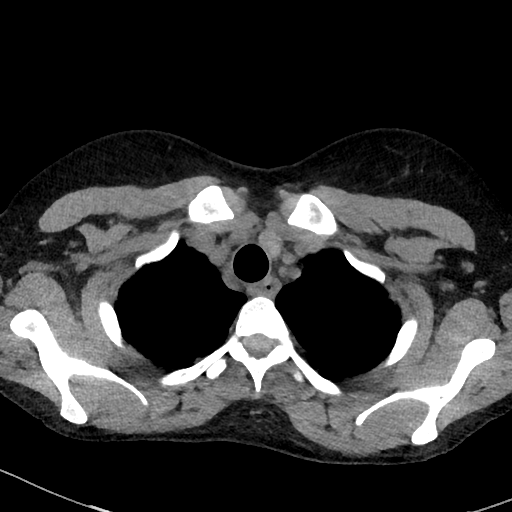
[im 107/131  lung]
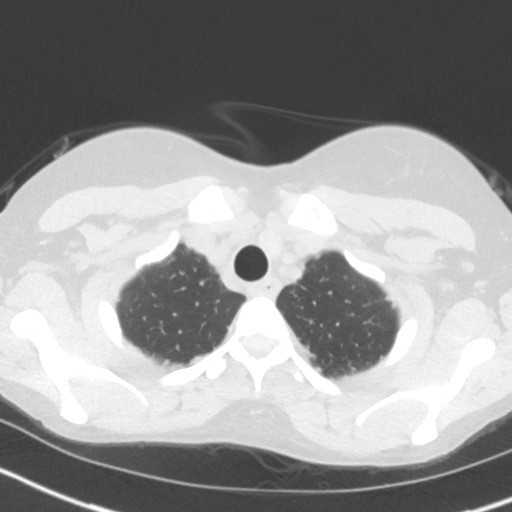
[im 119/131  lung]
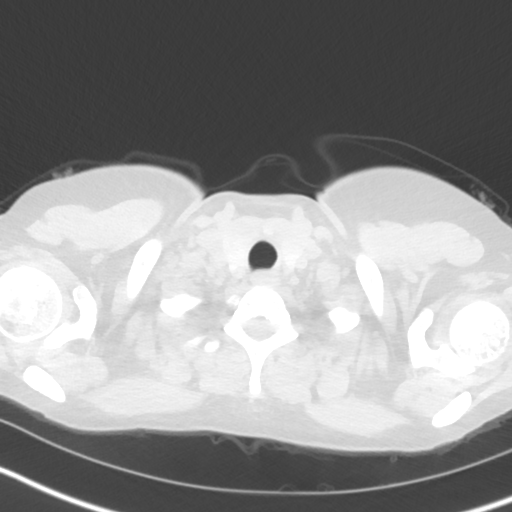

[Series 6: chest w/o 3mm st cor · coronal · non-contrast · 0.50mm/px · 3 of 77 slices shown]
[im 16/77  lung]
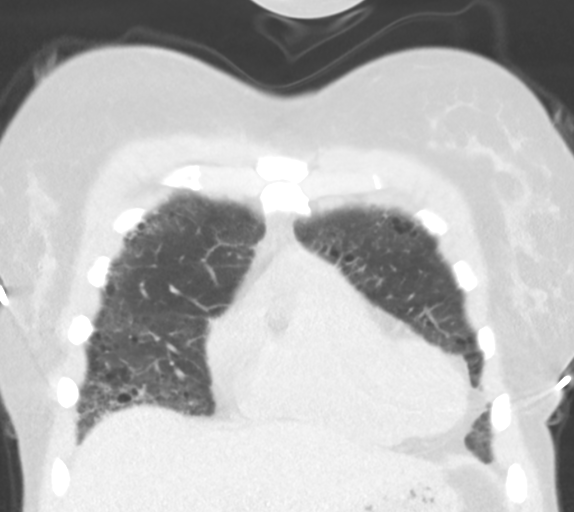
[im 31/77  lung]
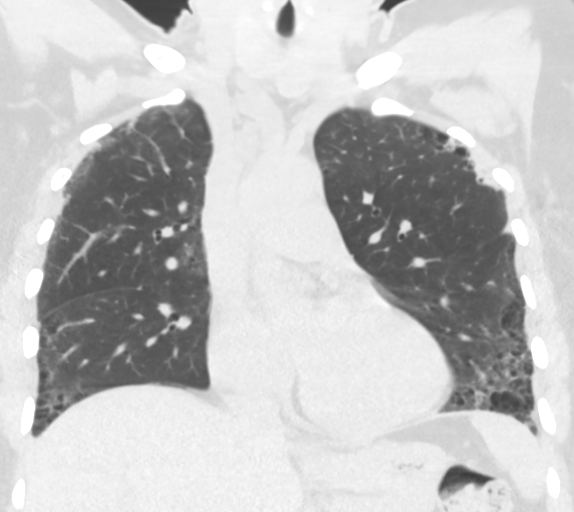
[im 46/77  lung]
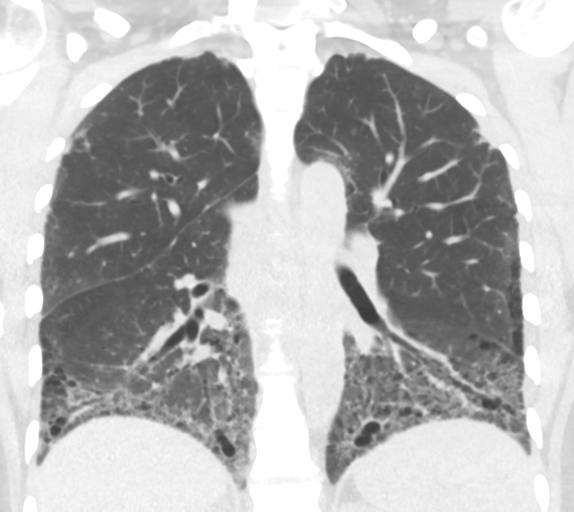

[13 of 36 positions shown; findings below may reference images not displayed]

FINDINGS: Cardiovascular: Heart size is borderline enlarged. There is no
significant pericardial fluid, thickening or pericardial
calcification. No coronary artery calcifications. There is a small
amount of calcification in the expected location of the occluded
ligamentum arteriosum (normal). No other atherosclerotic
calcifications are noted in the thoracic aorta.

Mediastinum/Nodes: 2.3 x 1.5 cm area of soft tissue prominence of
intermediate attenuation (36 HU) is noted in the right
paraesophageal region (image 73 of series 4), nonspecific, favored
to represent an enlarged lymph node. Mildly enlarged low right
paratracheal lymph node measuring 11 mm in short axis. No hilar
lymphadenopathy. Please note that accurate exclusion of hilar
adenopathy is limited on noncontrast CT scans. Patulous esophagus.
No axillary lymphadenopathy.

Lungs/Pleura: High-resolution images demonstrate extensive areas of
ground-glass attenuation, septal thickening, subpleural
reticulation, thickening of the peribronchovascular interstitium, as
well as extensive cylindrical, varicose and some cystic
bronchiectasis. These findings have a strong craniocaudal gradient,
and there are areas of honeycombing throughout the lung bases
bilaterally. Some subpleural sparing is noted in portions of the
lung bases bilaterally, which is unusual. Inspiratory and expiratory
imaging is unremarkable. No acute consolidative airspace disease. No
pleural effusions. No definite suspicious appearing pulmonary
nodules or masses.

Upper Abdomen: Unremarkable.

Musculoskeletal: There are no aggressive appearing lytic or blastic
lesions noted in the visualized portions of the skeleton.
IMPRESSION: 1. The appearance of the lungs is compatible with interstitial lung
disease, in the overall pattern is highly concerning for probable
usual interstitial pneumonia (UIP), although there are some areas of
subpleural sparing in the lung bases bilaterally which is unusual
for UIP). Repeat high-resolution chest CT is recommended in 6-12
months to assess for temporal changes in the appearance of the lung
parenchyma.
2. Patulous esophagus.
3. Mediastinal lymphadenopathy, likely related to underlying
interstitial lung disease. Attention at time of follow-up CT
examination is recommended.

## 2017-04-09 ENCOUNTER — Telehealth (HOSPITAL_COMMUNITY): Payer: Self-pay | Admitting: Internal Medicine

## 2017-04-09 NOTE — Telephone Encounter (Signed)
Left VM for patient to call the office. Needs to schedule f/u with Dr. Gala RomneyBensimhon, next available per Meredith StaggersHeather Schub, RN unless pt is having a problem.

## 2017-04-16 ENCOUNTER — Encounter (HOSPITAL_COMMUNITY): Payer: Self-pay | Admitting: Internal Medicine
